# Patient Record
Sex: Female | Born: 1945 | Race: White | Hispanic: No | Marital: Married | Smoking: Never smoker
Health system: Southern US, Community
[De-identification: ages and names within clinical notes are randomized; demographics above are authoritative.]

## PROBLEM LIST (undated history)

## (undated) DIAGNOSIS — G14 Postpolio syndrome: Secondary | ICD-10-CM

## (undated) DIAGNOSIS — I10 Essential (primary) hypertension: Secondary | ICD-10-CM

## (undated) DIAGNOSIS — F329 Major depressive disorder, single episode, unspecified: Secondary | ICD-10-CM

## (undated) DIAGNOSIS — K37 Unspecified appendicitis: Secondary | ICD-10-CM

## (undated) DIAGNOSIS — F3289 Other specified depressive episodes: Secondary | ICD-10-CM

## (undated) DIAGNOSIS — Z853 Personal history of malignant neoplasm of breast: Secondary | ICD-10-CM

## (undated) DIAGNOSIS — Z923 Personal history of irradiation: Secondary | ICD-10-CM

## (undated) DIAGNOSIS — M7130 Other bursal cyst, unspecified site: Secondary | ICD-10-CM

## (undated) DIAGNOSIS — E039 Hypothyroidism, unspecified: Secondary | ICD-10-CM

## (undated) DIAGNOSIS — E785 Hyperlipidemia, unspecified: Secondary | ICD-10-CM

## (undated) DIAGNOSIS — R0609 Other forms of dyspnea: Secondary | ICD-10-CM

## (undated) DIAGNOSIS — R0989 Other specified symptoms and signs involving the circulatory and respiratory systems: Secondary | ICD-10-CM

## (undated) HISTORY — PX: OTHER SURGICAL HISTORY: SHX169

## (undated) HISTORY — DX: Hypothyroidism, unspecified: E03.9

## (undated) HISTORY — PX: ABDOMINAL HYSTERECTOMY: SHX81

## (undated) HISTORY — DX: Other forms of dyspnea: R06.09

## (undated) HISTORY — DX: Essential (primary) hypertension: I10

## (undated) HISTORY — PX: BREAST BIOPSY: SHX20

## (undated) HISTORY — DX: Hyperlipidemia, unspecified: E78.5

## (undated) HISTORY — DX: Personal history of irradiation: Z92.3

## (undated) HISTORY — PX: BREAST LUMPECTOMY: SHX2

## (undated) HISTORY — DX: Unspecified appendicitis: K37

## (undated) HISTORY — DX: Other bursal cyst, unspecified site: M71.30

## (undated) HISTORY — DX: Other specified symptoms and signs involving the circulatory and respiratory systems: R09.89

## (undated) HISTORY — DX: Other specified depressive episodes: F32.89

## (undated) HISTORY — DX: Postpolio syndrome: G14

## (undated) HISTORY — DX: Personal history of malignant neoplasm of breast: Z85.3

## (undated) HISTORY — DX: Major depressive disorder, single episode, unspecified: F32.9

---

## 2003-07-05 ENCOUNTER — Encounter: Payer: Self-pay | Admitting: General Surgery

## 2003-07-05 ENCOUNTER — Encounter: Admission: RE | Admit: 2003-07-05 | Discharge: 2003-07-05 | Payer: Self-pay | Admitting: General Surgery

## 2003-07-05 ENCOUNTER — Encounter (INDEPENDENT_AMBULATORY_CARE_PROVIDER_SITE_OTHER): Payer: Self-pay | Admitting: *Deleted

## 2003-07-10 ENCOUNTER — Encounter: Payer: Self-pay | Admitting: General Surgery

## 2003-07-10 ENCOUNTER — Encounter (HOSPITAL_COMMUNITY): Admission: RE | Admit: 2003-07-10 | Discharge: 2003-10-08 | Payer: Self-pay | Admitting: General Surgery

## 2003-07-16 ENCOUNTER — Ambulatory Visit (HOSPITAL_COMMUNITY): Admission: RE | Admit: 2003-07-16 | Discharge: 2003-07-16 | Payer: Self-pay

## 2007-07-06 ENCOUNTER — Encounter: Admission: RE | Admit: 2007-07-06 | Discharge: 2007-07-06 | Payer: Self-pay | Admitting: Internal Medicine

## 2007-07-12 ENCOUNTER — Encounter: Payer: Self-pay | Admitting: Internal Medicine

## 2007-10-10 ENCOUNTER — Ambulatory Visit: Payer: Self-pay | Admitting: Internal Medicine

## 2007-10-10 DIAGNOSIS — R0609 Other forms of dyspnea: Secondary | ICD-10-CM

## 2007-10-10 DIAGNOSIS — R0989 Other specified symptoms and signs involving the circulatory and respiratory systems: Secondary | ICD-10-CM

## 2007-10-10 DIAGNOSIS — M7130 Other bursal cyst, unspecified site: Secondary | ICD-10-CM | POA: Insufficient documentation

## 2007-10-11 ENCOUNTER — Encounter (INDEPENDENT_AMBULATORY_CARE_PROVIDER_SITE_OTHER): Payer: Self-pay | Admitting: *Deleted

## 2007-10-11 DIAGNOSIS — F329 Major depressive disorder, single episode, unspecified: Secondary | ICD-10-CM

## 2007-10-11 DIAGNOSIS — E039 Hypothyroidism, unspecified: Secondary | ICD-10-CM | POA: Insufficient documentation

## 2007-10-11 DIAGNOSIS — Z853 Personal history of malignant neoplasm of breast: Secondary | ICD-10-CM

## 2007-10-11 DIAGNOSIS — I10 Essential (primary) hypertension: Secondary | ICD-10-CM | POA: Insufficient documentation

## 2007-10-11 DIAGNOSIS — E785 Hyperlipidemia, unspecified: Secondary | ICD-10-CM | POA: Insufficient documentation

## 2007-10-17 ENCOUNTER — Encounter: Admission: RE | Admit: 2007-10-17 | Discharge: 2007-10-17 | Payer: Self-pay | Admitting: Internal Medicine

## 2007-10-18 ENCOUNTER — Ambulatory Visit: Payer: Self-pay | Admitting: Pulmonary Disease

## 2007-10-18 ENCOUNTER — Encounter: Payer: Self-pay | Admitting: Internal Medicine

## 2007-10-19 ENCOUNTER — Telehealth: Payer: Self-pay | Admitting: Internal Medicine

## 2007-10-20 ENCOUNTER — Telehealth: Payer: Self-pay | Admitting: Internal Medicine

## 2007-10-29 ENCOUNTER — Encounter: Payer: Self-pay | Admitting: Internal Medicine

## 2007-11-01 ENCOUNTER — Encounter (INDEPENDENT_AMBULATORY_CARE_PROVIDER_SITE_OTHER): Payer: Self-pay | Admitting: *Deleted

## 2007-11-01 ENCOUNTER — Encounter: Payer: Self-pay | Admitting: Internal Medicine

## 2007-11-30 DIAGNOSIS — K37 Unspecified appendicitis: Secondary | ICD-10-CM

## 2007-11-30 HISTORY — DX: Unspecified appendicitis: K37

## 2007-11-30 HISTORY — PX: APPENDECTOMY: SHX54

## 2007-12-04 ENCOUNTER — Ambulatory Visit: Payer: Self-pay | Admitting: Internal Medicine

## 2007-12-16 ENCOUNTER — Telehealth: Payer: Self-pay | Admitting: Internal Medicine

## 2007-12-17 ENCOUNTER — Encounter (INDEPENDENT_AMBULATORY_CARE_PROVIDER_SITE_OTHER): Payer: Self-pay | Admitting: General Surgery

## 2007-12-17 ENCOUNTER — Observation Stay (HOSPITAL_COMMUNITY): Admission: EM | Admit: 2007-12-17 | Discharge: 2007-12-18 | Payer: Self-pay | Admitting: Emergency Medicine

## 2008-01-19 ENCOUNTER — Encounter: Payer: Self-pay | Admitting: Internal Medicine

## 2008-01-24 ENCOUNTER — Encounter: Payer: Self-pay | Admitting: Internal Medicine

## 2008-01-31 ENCOUNTER — Ambulatory Visit: Payer: Self-pay | Admitting: Internal Medicine

## 2008-01-31 LAB — CONVERTED CEMR LAB
Basophils Relative: 1.2 % — ABNORMAL HIGH (ref 0.0–1.0)
Eosinophils Relative: 2.3 % (ref 0.0–5.0)
HDL: 33.7 mg/dL — ABNORMAL LOW (ref >39.0)
Lymphocytes Relative: 26.7 % (ref 12.0–46.0)
Neutro Abs: 3.9 10*3/uL (ref 1.4–7.7)
Platelets: 269 10*3/uL (ref 150–400)
RBC: 4.37 M/uL (ref 3.87–5.11)
TSH: 2.65 microintl units/mL (ref 0.35–5.50)
VLDL: 17 mg/dL (ref 0–40)
WBC: 6.5 10*3/uL (ref 4.5–10.5)

## 2008-02-01 ENCOUNTER — Encounter: Payer: Self-pay | Admitting: Internal Medicine

## 2008-02-05 ENCOUNTER — Encounter: Admission: RE | Admit: 2008-02-05 | Discharge: 2008-02-05 | Payer: Self-pay | Admitting: Internal Medicine

## 2008-02-05 ENCOUNTER — Encounter: Payer: Self-pay | Admitting: Internal Medicine

## 2008-02-20 DIAGNOSIS — Z9189 Other specified personal risk factors, not elsewhere classified: Secondary | ICD-10-CM | POA: Insufficient documentation

## 2008-02-20 DIAGNOSIS — B91 Sequelae of poliomyelitis: Secondary | ICD-10-CM

## 2008-02-21 ENCOUNTER — Ambulatory Visit: Payer: Self-pay | Admitting: Internal Medicine

## 2008-02-21 DIAGNOSIS — E663 Overweight: Secondary | ICD-10-CM | POA: Insufficient documentation

## 2008-06-13 ENCOUNTER — Ambulatory Visit: Payer: Self-pay | Admitting: Internal Medicine

## 2008-07-31 ENCOUNTER — Ambulatory Visit: Payer: Self-pay | Admitting: Internal Medicine

## 2008-07-31 LAB — CONVERTED CEMR LAB
ALT: 19 units/L (ref 0–35)
AST: 22 units/L (ref 0–37)
Basophils Absolute: 0.1 10*3/uL (ref 0.0–0.1)
Bilirubin, Direct: 0.1 mg/dL (ref 0.0–0.3)
CO2: 31 meq/L (ref 19–32)
Chloride: 107 meq/L (ref 96–112)
Cholesterol: 285 mg/dL (ref 0–200)
HDL: 35.5 mg/dL — ABNORMAL LOW (ref >39.0)
Lymphocytes Relative: 22.1 % (ref 12.0–46.0)
MCHC: 34.1 g/dL (ref 30.0–36.0)
Neutrophils Relative %: 67.9 % (ref 43.0–77.0)
Platelets: 239 10*3/uL (ref 150–400)
RDW: 12 % (ref 11.5–14.6)
Sodium: 141 meq/L (ref 135–145)
TSH: 2.08 microintl units/mL (ref 0.35–5.50)
Total Bilirubin: 0.9 mg/dL (ref 0.3–1.2)
VLDL: 28 mg/dL (ref 0–40)

## 2008-08-02 ENCOUNTER — Encounter: Payer: Self-pay | Admitting: Internal Medicine

## 2008-08-06 ENCOUNTER — Encounter: Payer: Self-pay | Admitting: Internal Medicine

## 2008-08-08 ENCOUNTER — Encounter: Payer: Self-pay | Admitting: Internal Medicine

## 2008-08-12 ENCOUNTER — Encounter: Payer: Self-pay | Admitting: Internal Medicine

## 2008-08-15 ENCOUNTER — Ambulatory Visit: Payer: Self-pay | Admitting: Cardiology

## 2009-01-27 ENCOUNTER — Telehealth (INDEPENDENT_AMBULATORY_CARE_PROVIDER_SITE_OTHER): Payer: Self-pay | Admitting: *Deleted

## 2009-02-10 ENCOUNTER — Ambulatory Visit: Payer: Self-pay | Admitting: Internal Medicine

## 2009-02-10 LAB — CONVERTED CEMR LAB
ALT: 18 units/L (ref 0–35)
AST: 19 units/L (ref 0–37)
Alkaline Phosphatase: 92 units/L (ref 39–117)
Basophils Absolute: 0.1 10*3/uL (ref 0.0–0.1)
Basophils Relative: 0.9 % (ref 0.0–3.0)
Bilirubin, Direct: 0.1 mg/dL (ref 0.0–0.3)
CO2: 29 meq/L (ref 19–32)
Chloride: 101 meq/L (ref 96–112)
Creatinine, Ser: 1.5 mg/dL — ABNORMAL HIGH (ref 0.4–1.2)
HDL: 35 mg/dL — ABNORMAL LOW (ref >39.0)
Lymphocytes Relative: 24 % (ref 12.0–46.0)
MCHC: 33.4 g/dL (ref 30.0–36.0)
Neutrophils Relative %: 60.5 % (ref 43.0–77.0)
Platelets: 217 10*3/uL (ref 150–400)
Potassium: 3.9 meq/L (ref 3.5–5.1)
RBC: 4.17 M/uL (ref 3.87–5.11)
Sodium: 142 meq/L (ref 135–145)
Total Bilirubin: 1 mg/dL (ref 0.3–1.2)
Triglycerides: 218 mg/dL (ref 0–149)
VLDL: 44 mg/dL — ABNORMAL HIGH (ref 0–40)

## 2009-02-12 ENCOUNTER — Encounter: Payer: Self-pay | Admitting: Internal Medicine

## 2009-02-19 ENCOUNTER — Encounter: Payer: Self-pay | Admitting: Internal Medicine

## 2009-02-21 ENCOUNTER — Encounter: Payer: Self-pay | Admitting: Internal Medicine

## 2009-03-04 ENCOUNTER — Ambulatory Visit: Payer: Self-pay | Admitting: Internal Medicine

## 2009-03-04 LAB — CONVERTED CEMR LAB
Free T4: 0.8 ng/dL (ref 0.6–1.6)
T3 Uptake Ratio: 39 % — ABNORMAL HIGH (ref 22.5–37.0)
Vit D, 25-Hydroxy: 22 ng/mL — ABNORMAL LOW (ref 30–89)

## 2009-03-09 ENCOUNTER — Encounter: Payer: Self-pay | Admitting: Internal Medicine

## 2009-03-10 ENCOUNTER — Telehealth (INDEPENDENT_AMBULATORY_CARE_PROVIDER_SITE_OTHER): Payer: Self-pay | Admitting: *Deleted

## 2009-03-11 ENCOUNTER — Ambulatory Visit: Payer: Self-pay | Admitting: Internal Medicine

## 2009-03-11 DIAGNOSIS — M79609 Pain in unspecified limb: Secondary | ICD-10-CM

## 2009-03-13 ENCOUNTER — Encounter: Admission: RE | Admit: 2009-03-13 | Discharge: 2009-03-13 | Payer: Self-pay | Admitting: Orthopedic Surgery

## 2009-06-16 ENCOUNTER — Ambulatory Visit: Payer: Self-pay | Admitting: Internal Medicine

## 2009-06-20 ENCOUNTER — Ambulatory Visit: Payer: Self-pay | Admitting: Internal Medicine

## 2009-06-20 LAB — CONVERTED CEMR LAB
AST: 28 units/L (ref 0–37)
Albumin: 3.9 g/dL (ref 3.5–5.2)
Alkaline Phosphatase: 84 units/L (ref 39–117)
Anti Nuclear Antibody(ANA): POSITIVE — AB
BUN: 20 mg/dL (ref 6–23)
Bilirubin, Direct: 0.1 mg/dL (ref 0.0–0.3)
Calcium: 9.2 mg/dL (ref 8.4–10.5)
Cholesterol: 254 mg/dL — ABNORMAL HIGH (ref 0–200)
Creatinine, Ser: 1.2 mg/dL (ref 0.4–1.2)
Direct LDL: 207.6 mg/dL
Sed Rate: 19 mm/hr (ref 0–22)
Total CK: 163 units/L (ref 7–177)
Total Protein: 7.2 g/dL (ref 6.0–8.3)

## 2009-06-23 ENCOUNTER — Encounter: Payer: Self-pay | Admitting: Internal Medicine

## 2009-06-23 LAB — CONVERTED CEMR LAB: CRP, High Sensitivity: 4.3 — ABNORMAL HIGH

## 2009-06-25 ENCOUNTER — Encounter: Payer: Self-pay | Admitting: Internal Medicine

## 2009-08-11 ENCOUNTER — Encounter: Payer: Self-pay | Admitting: Internal Medicine

## 2009-08-28 ENCOUNTER — Encounter: Payer: Self-pay | Admitting: Internal Medicine

## 2009-11-27 ENCOUNTER — Encounter: Payer: Self-pay | Admitting: Internal Medicine

## 2010-01-19 ENCOUNTER — Ambulatory Visit: Payer: Self-pay | Admitting: Internal Medicine

## 2010-01-19 ENCOUNTER — Encounter: Payer: Self-pay | Admitting: Internal Medicine

## 2010-01-19 DIAGNOSIS — J069 Acute upper respiratory infection, unspecified: Secondary | ICD-10-CM | POA: Insufficient documentation

## 2010-01-19 DIAGNOSIS — M109 Gout, unspecified: Secondary | ICD-10-CM | POA: Insufficient documentation

## 2010-01-19 LAB — CONVERTED CEMR LAB
Eosinophils Relative: 2.5 % (ref 0.0–5.0)
HCT: 38.7 % (ref 36.0–46.0)
Hemoglobin: 13 g/dL (ref 12.0–15.0)
Lymphs Abs: 1.7 10*3/uL (ref 0.7–4.0)
MCV: 90.8 fL (ref 78.0–100.0)
Monocytes Absolute: 0.9 10*3/uL (ref 0.1–1.0)
Monocytes Relative: 8 % (ref 3.0–12.0)
Neutro Abs: 8.9 10*3/uL — ABNORMAL HIGH (ref 1.4–7.7)
Platelets: 227 10*3/uL (ref 150.0–400.0)
RDW: 12.4 % (ref 11.5–14.6)
Uric Acid, Serum: 7.8 mg/dL — ABNORMAL HIGH (ref 2.4–7.0)
WBC: 11.8 10*3/uL — ABNORMAL HIGH (ref 4.5–10.5)

## 2010-01-20 ENCOUNTER — Ambulatory Visit: Payer: Self-pay | Admitting: Internal Medicine

## 2010-01-21 ENCOUNTER — Encounter: Payer: Self-pay | Admitting: Internal Medicine

## 2010-01-27 ENCOUNTER — Telehealth: Payer: Self-pay | Admitting: Internal Medicine

## 2010-02-26 ENCOUNTER — Encounter: Payer: Self-pay | Admitting: Internal Medicine

## 2010-07-23 ENCOUNTER — Ambulatory Visit: Payer: Self-pay | Admitting: Internal Medicine

## 2010-07-24 ENCOUNTER — Telehealth: Payer: Self-pay | Admitting: Internal Medicine

## 2010-07-27 ENCOUNTER — Encounter: Payer: Self-pay | Admitting: Internal Medicine

## 2010-07-27 ENCOUNTER — Ambulatory Visit: Payer: Self-pay | Admitting: Family Medicine

## 2010-07-30 ENCOUNTER — Encounter: Payer: Self-pay | Admitting: Internal Medicine

## 2010-07-31 ENCOUNTER — Telehealth: Payer: Self-pay | Admitting: Internal Medicine

## 2010-08-11 ENCOUNTER — Telehealth: Payer: Self-pay | Admitting: Internal Medicine

## 2010-08-13 ENCOUNTER — Telehealth: Payer: Self-pay | Admitting: Internal Medicine

## 2010-08-14 ENCOUNTER — Ambulatory Visit: Payer: Self-pay | Admitting: Internal Medicine

## 2010-08-19 ENCOUNTER — Ambulatory Visit: Payer: Self-pay | Admitting: Internal Medicine

## 2010-08-19 LAB — CONVERTED CEMR LAB
Basophils Absolute: 0.1 10*3/uL (ref 0.0–0.1)
Basophils Relative: 0.6 % (ref 0.0–3.0)
Eosinophils Absolute: 0.1 10*3/uL (ref 0.0–0.7)
Eosinophils Relative: 1.4 % (ref 0.0–5.0)
HCT: 40.1 % (ref 36.0–46.0)
Hemoglobin: 13.8 g/dL (ref 12.0–15.0)
Lymphocytes Relative: 23.8 % (ref 12.0–46.0)
Lymphs Abs: 2.1 10*3/uL (ref 0.7–4.0)
MCHC: 34.3 g/dL (ref 30.0–36.0)
MCV: 90.5 fL (ref 78.0–100.0)
Monocytes Absolute: 0.7 10*3/uL (ref 0.1–1.0)
Monocytes Relative: 7.7 % (ref 3.0–12.0)
Neutro Abs: 6 10*3/uL (ref 1.4–7.7)
Neutrophils Relative %: 66.5 % (ref 43.0–77.0)
Platelets: 194 10*3/uL (ref 150.0–400.0)
RBC: 4.44 M/uL (ref 3.87–5.11)
RDW: 13 % (ref 11.5–14.6)
TSH: 2 microintl units/mL (ref 0.35–5.50)
WBC: 9 10*3/uL (ref 4.5–10.5)

## 2010-08-25 ENCOUNTER — Telehealth: Payer: Self-pay | Admitting: Internal Medicine

## 2010-08-27 ENCOUNTER — Ambulatory Visit: Payer: Self-pay | Admitting: Family Medicine

## 2010-08-27 ENCOUNTER — Encounter: Payer: Self-pay | Admitting: Internal Medicine

## 2010-08-27 DIAGNOSIS — M76829 Posterior tibial tendinitis, unspecified leg: Secondary | ICD-10-CM | POA: Insufficient documentation

## 2010-08-27 DIAGNOSIS — M2170 Unequal limb length (acquired), unspecified site: Secondary | ICD-10-CM | POA: Insufficient documentation

## 2010-08-27 DIAGNOSIS — M461 Sacroiliitis, not elsewhere classified: Secondary | ICD-10-CM | POA: Insufficient documentation

## 2010-09-04 ENCOUNTER — Encounter: Payer: Self-pay | Admitting: Internal Medicine

## 2010-10-01 ENCOUNTER — Ambulatory Visit: Payer: Self-pay | Admitting: Internal Medicine

## 2010-10-29 ENCOUNTER — Encounter: Payer: Self-pay | Admitting: Internal Medicine

## 2010-11-03 ENCOUNTER — Encounter: Payer: Self-pay | Admitting: Internal Medicine

## 2010-11-03 ENCOUNTER — Encounter: Admission: RE | Admit: 2010-11-03 | Discharge: 2010-11-03 | Payer: Self-pay | Admitting: Orthopedic Surgery

## 2010-12-20 ENCOUNTER — Encounter: Payer: Self-pay | Admitting: Internal Medicine

## 2010-12-29 NOTE — Assessment & Plan Note (Signed)
Summary: PAIN IN BACK/NWS #   Vital Signs:  Patient profile:   65 year old female Height:      67 inches O2 Sat:      97 % on Room air Temp:     98.0 degrees F oral Pulse rate:   105 / minute BP sitting:   119 / 88  (left arm) Cuff size:   regular  Vitals Entered By: Bill Salinas CMA (July 23, 2010 1:51 PM)  O2 Flow:  Room air CC: Pt here with c/o ongoing back pain/ ab Comments Pt states she is no longer taking multiplevit, asa, fish oil or Levaquin/ ab   Primary Care Meena Barrantes:  Norins  CC:  Pt here with c/o ongoing back pain/ ab.  History of Present Illness: Patient presents for evaluation of back pain. She fell down a flight of stairs Aug 4th. She had low back pain. She went to see Dr. Lajoyce Corners August 10th - she had x-rays. She was told she had strain but no fracture. She reports that he put her on steroids.This has not helped and the pain is worse. She hurts when she sits, stands, when she gets up from a chair. No pain when laying down. The pain is worse with position chaqnge and initiating walking. She points to the buttock over the SI joint right much worse than left. No paresthesia, no focal weakness. No loss of control  bowel or bladder.   Reviewed L-S spine film on CD: lateral view - normal vertebra and disk space, A_P view - scoliosis, no vertebral abnl noted. SI joints visualized but not well.   Current Medications (verified): 1)  Levothyroxine Sodium 50 Mcg  Tabs (Levothyroxine Sodium) .Marland Kitchen.. 1 By Mouth Once Daily 2)  Triamterene-Hctz 75-50 Mg  Tabs (Triamterene-Hctz) .Marland Kitchen.. 1 By Mouth Once Daily 3)  Multivitamins   Tabs (Multiple Vitamin) .Marland Kitchen.. 1 By Mouth Once Daily 4)  Adult Aspirin Low Strength 81 Mg  Tbdp (Aspirin) .... Take 1 Tablet By Mouth Once A Day 5)  Fish Oil 1200 Mg Caps (Omega-3 Fatty Acids) .... Take 1 Tablet By Mouth Two Times A Day 6)  Allopurinol 300 Mg Tabs (Allopurinol) .Marland Kitchen.. 1 By Mouth Once Daily For Gout 7)  Levaquin 500 Mg Tabs (Levofloxacin) .Marland Kitchen.. 1 Tab  Once A Day X 7 Days 8)  Nabumetone 750 Mg Tabs (Nabumetone) .Marland Kitchen.. 1 Tab Two Times A Day  Allergies (verified): 1)  ! Zocor 2)  ! * Antibiotics 3)  ! Sulfa 4)  ! Epinephrine 5)  ! Codeine 6)  ! Enalapril Maleate (Enalapril Maleate) 7)  ! 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) HYPOTHYROIDISM (ICD-244.9) HYPERTENSION (ICD-401.9) HYPERLIPIDEMIA (ICD-272.4) DEPRESSION (ICD-311) BREAST CANCER, HX OF (ICD-V10.3) APPENDICITIS jAN '09     Past Surgical History: Last updated: 02/21/2008 * Hx of POLIO RELATED SURGERIES BREAST BIOPSY, HX OF (ICD-V15.9) * Hx of HYSTERECTOMY * Hx of LUMPECTOMY RIGHT BREAST Appendectomy jAN '09  Family History: Last updated: 10/10/2007 non-contributory  Social History: Last updated: 10/10/2007 married SO- retired Optician, dispensing, 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  The patient denies anorexia, fever, weight loss, weight gain, decreased hearing, chest pain, dyspnea on exertion, prolonged cough, hemoptysis, abdominal pain, hematochezia, hematuria, muscle weakness, transient blindness, unusual weight change, and enlarged lymph nodes.  Physical Exam  General:  Overweight white female Head:  normocephalic and atraumatic.   Neck:  supple, full ROM, and no thyromegaly.   Lungs:  normal respiratory effort and normal breath sounds.   Heart:  normal rate and regular rhythm.   Msk:  back exam: able to stand without assistance, nl flex to 180 degrees, nl gait, nl heel walk, able to get up to exam table without assistance, nl SLR sitting, nl patellar reflex, nl sensation. Point tenderness in the poximity of the right SI joint.   Impression & Recommendations:  Problem # 1:  LOW BACK PAIN,  ACUTE (ICD-724.2)  Patient with acute pain at the SI joint. Non-radicular exam.  Plan - steroid injection: verbal consent obtained, prepped with betadine, injected 80mg  depomedrol with 1 cc 1% xylocaine at the point of maximum tenderness. Bandaid applied. Tolerated well with immediate relief           Pelvic x-ray r/o fracture.  Her updated medication list for this problem includes:    Adult Aspirin Low Strength 81 Mg Tbdp (Aspirin) .Marland Kitchen... Take 1 tablet by mouth once a day    Nabumetone 750 Mg Tabs (Nabumetone) .Marland Kitchen... 1 tab two times a day  Orders: T-Pelvis 1or 2 views (72170TC) T-Sacroiliac Joints ([address]UE) Trigger Point Injection (1 or 2 muscles) (45409) Depo- Medrol 80mg  (J1040)  Addendum- X-ray pelvis and SI joints normal  Complete Medication List: 1)  Levothyroxine Sodium 50 Mcg Tabs (Levothyroxine sodium) .Marland Kitchen.. 1 by mouth once daily 2)  Triamterene-hctz 75-50 Mg Tabs (Triamterene-hctz) .Marland Kitchen.. 1 by mouth once daily 3)  Multivitamins Tabs (Multiple vitamin) .Marland Kitchen.. 1 by mouth once daily 4)  Adult Aspirin Low Strength 81 Mg Tbdp (Aspirin) .... Take 1 tablet by mouth once a day 5)  Fish Oil 1200 Mg Caps (Omega-3 fatty acids) .... Take 1 tablet by mouth two times a day 6)  Allopurinol 300 Mg Tabs (Allopurinol) .Marland Kitchen.. 1 by mouth once daily for gout 7)  Levaquin 500 Mg Tabs (Levofloxacin) .Marland Kitchen.. 1 tab once a day x 7 days 8)  Nabumetone 750 Mg Tabs (Nabumetone) .Marland Kitchen.. 1 tab two times a day

## 2010-12-29 NOTE — Letter (Signed)
Summary: [facility]  [facility]   Imported By: Sherian Rein 09/08/2010 15:10:44  _____________________________________________________________________  External Attachment:    Type:   Image     Comment:   External Document

## 2010-12-29 NOTE — Progress Notes (Signed)
Summary: LEVAQUIN  Phone Note Call from Patient   Summary of Call: Pt is somewhat concerned about cost of levaquin (80$). She is aware of her allergy to all other antibiotics. I suggested she print online coupon from drug company. Pt does feel much better. No fever, cough went from all day & night to 'every once in a while'. She has a little sinus congestion & drainage. Pt played 2  tennis matches yesterday with no problems. She will take rx or call office if symptoms become worse.  Initial call taken by: Lamar Sprinkles, CMA,  January 27, 2010 5:32 PM

## 2010-12-29 NOTE — Letter (Signed)
   [facility] [address] [place], [place]  [address] Phone: [phone]      September 04, 2010   [name] [address] [place], [place] [address]  RE:  LAB RESULTS  Dear  [name],  The following is an interpretation of your most recent lab tests.  Please take note of any instructions provided or changes to medications that have resulted from your lab work.   Your bone density study revealed normal bone density.   Please come see me if you have any questions about these lab results.   Sincerely Yours,    Jacques Navy MD

## 2010-12-29 NOTE — Letter (Signed)
Summary: Email from patient regarding illness  Email from patient regarding illness   Imported By: Sherian Rein 01/26/2010 11:45:56  _____________________________________________________________________  External Attachment:    Type:   Image     Comment:   External Document

## 2010-12-29 NOTE — Letter (Signed)
Summary: [facility]   Imported By: Lester Nevis 03/12/2010 10:40:41  _____________________________________________________________________  External Attachment:    Type:   Image     Comment:   External Document

## 2010-12-29 NOTE — Letter (Signed)
Summary: [facility]   Imported By: Lester Nash 12/08/2009 09:58:44  _____________________________________________________________________  External Attachment:    Type:   Image     Comment:   External Document

## 2010-12-29 NOTE — Progress Notes (Signed)
  Phone Note Refill Request Message from:  Fax from Pharmacy on August 11, 2010 3:40 PM  Refills Requested: Medication #1:  TRIAMTERENE-HCTZ 75-50 MG  TABS 1 by mouth once daily Initial call taken by: Ami Bullins CMA,  August 11, 2010 3:40 PM    Prescriptions: TRIAMTERENE-HCTZ 75-50 MG  TABS (TRIAMTERENE-HCTZ) 1 by mouth once daily  #30 Each x 6   Entered by:   Ami Bullins CMA   Authorized by:   Jacques Navy MD   Signed by:   Bill Salinas CMA on 08/11/2010   Method used:   Electronically to        [facility]* (retail)       [address]       [place], [place]  [id number]       Ph: [phone]       Fax: [phone]   RxID:   [id number]

## 2010-12-29 NOTE — Letter (Signed)
Summary: Email from patient  Email from patient   Imported By: Lester [place] 08/04/2010 12:56:15  _____________________________________________________________________  External Attachment:    Type:   Image     Comment:   External Document

## 2010-12-29 NOTE — Assessment & Plan Note (Signed)
Summary: trigger point injection   Vital Signs:  Patient profile:   65 year old female Height:      67 inches Weight:      206 pounds BMI:     32.38 O2 Sat:      96 % on Room air Temp:     97.7 degrees F oral Pulse rate:   86 / minute BP sitting:   136 / 88  (left arm) Cuff size:   regular  Vitals Entered By: Bill Salinas CMA (August 14, 2010 9:36 AM)  O2 Flow:  Room air CC: pt here for trigger point injection/ ab Comments Pt states the only two medications she takes are the levothyroxine and triamterine. other meds can be dc'd/ ab   Primary Care Provider:  Norins  CC:  pt here for trigger point injection/ ab.  History of Present Illness: Patient with pain in the right buttock with radiation to the leg. She presents for trigger point injection. she has been referred to Dr. Dallas Schimke for evaluation for possible MSK origin   Current Medications (verified): 1)  Levothyroxine Sodium 50 Mcg  Tabs (Levothyroxine Sodium) .Marland Kitchen.. 1 By Mouth Once Daily 2)  Triamterene-Hctz 75-50 Mg  Tabs (Triamterene-Hctz) .Marland Kitchen.. 1 By Mouth Once Daily 3)  Multivitamins   Tabs (Multiple Vitamin) .Marland Kitchen.. 1 By Mouth Once Daily 4)  Adult Aspirin Low Strength 81 Mg  Tbdp (Aspirin) .... Take 1 Tablet By Mouth Once A Day 5)  Fish Oil 1200 Mg Caps (Omega-3 Fatty Acids) .... Take 1 Tablet By Mouth Two Times A Day 6)  Allopurinol 300 Mg Tabs (Allopurinol) .Marland Kitchen.. 1 By Mouth Once Daily For Gout 7)  Levaquin 500 Mg Tabs (Levofloxacin) .Marland Kitchen.. 1 Tab Once A Day X 7 Days 8)  Nabumetone 750 Mg Tabs (Nabumetone) .Marland Kitchen.. 1 Tab Two Times A Day  Allergies (verified): 1)  ! Zocor 2)  ! * Antibiotics 3)  ! Sulfa 4)  ! Epinephrine 5)  ! Codeine 6)  ! Enalapril Maleate (Enalapril Maleate) 7)  ! 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  The patient denies anorexia, fever, weight loss, chest pain, syncope, dyspnea on exertion, abdominal pain, muscle weakness, depression, and enlarged lymph nodes.    Physical Exam  General:  loverweight white female in no acute distress Head:  normocephalic and atraumatic.   Lungs:  normal respiratory effort and normal breath sounds.   Heart:  normal rate and regular rhythm.   Neurologic:  tender deep tissue of buttock just lateral to SI joint.   Impression & Recommendations:  Problem # 1:  LOW BACK PAIN, ACUTE (ICD-724.2)  for trigger point injection. Tolerated well and had rapid relief. No complications.   Her updated medication list for this problem includes:    Adult Aspirin Low Strength 81 Mg Tbdp (Aspirin) .Marland Kitchen... Take 1 tablet by mouth once a day    Nabumetone 750 Mg Tabs (Nabumetone) .Marland Kitchen... 1 tab two  times a day  Orders: Trigger Point Injection (1 or 2 muscles) (54098) Depo- Medrol 40mg  (J1030)  Complete Medication List: 1)  Levothyroxine Sodium 50 Mcg Tabs (Levothyroxine sodium) .Marland Kitchen.. 1 by mouth once daily 2)  Triamterene-hctz 75-50 Mg Tabs (Triamterene-hctz) .Marland Kitchen.. 1 by mouth once daily 3)  Multivitamins Tabs (Multiple vitamin) .Marland Kitchen.. 1 by mouth once daily 4)  Adult Aspirin Low Strength 81 Mg Tbdp (Aspirin) .... Take 1 tablet by mouth once a day 5)  Fish Oil 1200 Mg Caps (Omega-3 fatty acids) .... Take 1 tablet by mouth two times a day 6)  Allopurinol 300 Mg Tabs (Allopurinol) .Marland Kitchen.. 1 by mouth once daily for gout 7)   Levaquin 500 Mg Tabs (Levofloxacin) .Marland Kitchen.. 1 tab once a day x 7 days 8)  Nabumetone 750 Mg Tabs (Nabumetone) .Marland Kitchen.. 1 tab two times a day   Procedure Note Last Tetanus: Historical (12/30/2001)  Injections: The patient complains of pain and tenderness. Indication: acute pain  Procedure # 1: trigger point injection    Location: right buttock    Technique: 23g 1.5" needle    Medication: 40 mg depomedrol    Anesthesia: xylocain 1% plain    Comment: informed verbal consent. Tolerated procedure well and had rapid relief due to xylocain  Cleaned and prepped with: betadine

## 2010-12-29 NOTE — Assessment & Plan Note (Signed)
Summary: BACK PROBLEM/ PER E-MAIL/ $50 /NWS   Vital Signs:  Patient profile:   65 year old female Height:      67 inches Weight:      208 pounds BMI:     32.70 O2 Sat:      97 % on Room air Temp:     97.7 degrees F oral Pulse rate:   100 / minute BP sitting:   122 / 80  (left arm) Cuff size:   large  Vitals Entered By: Bill Salinas CMA (June 16, 2009 3:56 PM)  O2 Flow:  Room air CC: pt here for on going back problems and c/o right eye oozing a clear substance x 1 month/ pt states she is no longer taking  Ergocalciferol, crestor, cyclobenzaprine or gabapentin/ ab   Primary Care Provider:  Lise Pincus  CC:  pt here for on going back problems and c/o right eye oozing a clear substance x 1 month/ pt states she is no longer taking  Ergocalciferol, crestor, and cyclobenzaprine or gabapentin/ ab.  History of Present Illness: Patinet has been having back trouble: a weariness in the back where she feels she must stop and rest. No frank pain. She will get immediate relief if she sits down in an armchair. She will notice that she cannot continue to play tennis when she is having trouble. No paresthesis, no frank weakness. The level of discomfort is at the bra line level. No arm weakness of paresthesia. No breathing trouble/shortness of breath. No problems at rest, only with exertion. These sympotms have been present for several years but are worse now then  before.  chart reviewed: last CT chest in March '09- unchanged pulmonary nodule, pulmonary scarring, no active lesion.   Current Medications (verified): 1)  Levothyroxine Sodium 50 Mcg  Tabs (Levothyroxine Sodium) .Marland Kitchen.. 1 By Mouth Once Daily 2)  Triamterene-Hctz 75-50 Mg  Tabs (Triamterene-Hctz) .Marland Kitchen.. 1 By Mouth Once Daily 3)  Multivitamins   Tabs (Multiple Vitamin) .Marland Kitchen.. 1 By Mouth Once Daily 4)  Adult Aspirin Low Strength 81 Mg  Tbdp (Aspirin) .... Take 1 Tablet By Mouth Once A Day 5)  Fish Oil 1200 Mg Caps (Omega-3 Fatty Acids) .... Take 1  Tablet By Mouth Two Times A Day 6)  Ergocalciferol 50000 Unit Caps (Ergocalciferol) .Marland Kitchen.. 1 A Week For 8 Weeks. 7)  Crestor 5 Mg Tabs (Rosuvastatin Calcium) .... Take One Tab By Mouth Every 3 Days 8)  Cyclobenzaprine Hcl 5 Mg Tabs (Cyclobenzaprine Hcl) .Marland Kitchen.. 1 By Mouth Three Times A Day As Needed Spasm in Foot 9)  Gabapentin 100 Mg Caps (Gabapentin) .... 21 By Mouth Once Daily X 3, 1 By Mouth Two Times A Day X 3, Then 1 By Mouth Three Times A Day X7-10 Days and Reassess.  Allergies (verified): 1)  ! Zocor 2)  ! * Antibiotics 3)  ! Sulfa 4)  ! Epinephrine 5)  ! Codeine 6)  ! Enalapril Maleate (Enalapril Maleate) 7)  ! Taxol PMH-FH-SH reviewed for relevance  Physical Exam  General:  heavy set white female Head:  normocephalic and atraumatic.   Lungs:  normal respiratory effort, no intercostal retractions, no accessory muscle use, normal breath sounds, no dullness, and no wheezes.   Heart:  normal rate and regular rhythm.   Msk:  normal ROM, no joint tenderness, no joint swelling, no joint warmth, and no joint deformities.   Pulses:  2+ radial pulses bilaterally.  Neurologic:  Normal motor strength. Skin:  turgor normal and color  normal.   Psych:  Oriented X3, memory intact for recent and remote, and normally interactive.     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 She reports that this is progressively getting worse but she has no limitation in her activities for the most part. Will rule out connective tissue/inflammatory disease.  Plan: ESR, CRP, ANA, CK total  Complete Medication List: 1)  Levothyroxine Sodium 50 Mcg Tabs (Levothyroxine sodium) .Marland Kitchen.. 1 by mouth once daily 2)  Triamterene-hctz 75-50 Mg Tabs (Triamterene-hctz) .Marland Kitchen.. 1 by mouth once daily 3)  Multivitamins Tabs (Multiple vitamin) .Marland Kitchen.. 1 by mouth once daily 4)  Adult Aspirin Low Strength 81 Mg Tbdp (Aspirin)  .... Take 1 tablet by mouth once a day 5)  Fish Oil 1200 Mg Caps (Omega-3 fatty acids) .... Take 1 tablet by mouth two times a day 6)  Ergocalciferol 50000 Unit Caps (Ergocalciferol) .Marland Kitchen.. 1 a week for 8 weeks. 7)  Crestor 5 Mg Tabs (Rosuvastatin calcium) .... Take one tab by mouth every 3 days 8)  Cyclobenzaprine Hcl 5 Mg Tabs (Cyclobenzaprine hcl) .Marland Kitchen.. 1 by mouth three times a day as needed spasm in foot 9)  Gabapentin 100 Mg Caps (Gabapentin) .... 21 by mouth once daily x 3, 1 by mouth two times a day x 3, then 1 by mouth three times a day x7-10 days and reassess.   Preventive Care Screening  Mammogram:    Date:  08/06/2008    Results:  normal   Last Tetanus Booster:    Date:  12/30/2001    Results:  Historical

## 2010-12-29 NOTE — Assessment & Plan Note (Signed)
Summary: low back pain/cd   Vital Signs:  Patient profile:   65 year old female Height:      67 inches Weight:      206 pounds BMI:     32.38 O2 Sat:      96 % on Room air Temp:     98.6 degrees F oral Pulse rate:   92 / minute BP sitting:   160 / 100  (left arm) Cuff size:   large  Vitals Entered By: Bill Salinas CMA (October 01, 2010 9:49 AM)  O2 Flow:  Room air CC: pt here for evaluation of low back pain. she is no longer taking allopurinol/ ab   Primary Care Luqman Perrelli:  Norins  CC:  pt here for evaluation of low back pain. she is no longer taking allopurinol/ ab.  History of Present Illness: presents for persistent back pain. She can actually feel something slip out of place and then pop back into place. The pain will subside when she can "Pop" her back into place. this is a long term problem. She has seen Dr. Dallas Schimke. 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 She reports that when this was addressed in the past she had more pain. Dr. Dallas Schimke has started treatment with a small wedge in the left shoe and will at the next visit custom make an orthotic.  Today she is seeking relief of her pain. She did have a trigger point and SI joint injection in the past which did help.   Current Medications (verified): 1)  Levothyroxine Sodium 50 Mcg  Tabs (Levothyroxine Sodium) .Marland Kitchen.. 1 By Mouth Once Daily 2)  Triamterene-Hctz 75-50 Mg  Tabs (Triamterene-Hctz) .Marland Kitchen.. 1 By Mouth Once Daily 3)  Allopurinol 300 Mg Tabs (Allopurinol) .Marland Kitchen.. 1 By Mouth Once Daily For Gout  Allergies (verified): 1)  ! Zocor 2)  ! * Antibiotics 3)  ! Sulfa 4)  ! Epinephrine 5)  ! Codeine 6)  ! Enalapril Maleate (Enalapril Maleate) 7)  ! Taxol PMH-FH-SH reviewed-no changes except otherwise noted  Review of Systems       The patient complains of difficulty walking.  The patient denies anorexia, fever, weight loss, chest pain, dyspnea on  exertion, headaches, abdominal pain, incontinence, muscle weakness, depression, abnormal bleeding, and enlarged lymph nodes.    Physical Exam  General:  overweight woman in no acute distress Head:  normocephalic and atraumatic.   Eyes:  vision grossly intact, pupils equal, pupils round, and corneas and lenses clear.   Neck:  supple.   Lungs:  normal respiratory effort and normal breath sounds.   Heart:  normal rate and regular rhythm.   Msk:  no joint tenderness, no joint swelling, and no joint deformities.  Tender to palpation and pressure in the area of the right SI joint.  Pulses:  2+ pulse Neurologic:  alert & oriented X3, cranial nerves II-XII intact, and gait normal.   Skin:  turgor normal and color normal.   Psych:  Oriented X3, normally interactive, good eye contact, and not anxious appearing.     Impression & Recommendations:  Problem # 1:  SACROILIITIS, RIGHT (ICD-720.2)  patient with increased pain in the area of the right SI joint.  Plan - SI joint/trigger point injection           keep follow-up with Dr. Dallas Schimke.  Orders: Trigger Point Injection Single Tendon Origin/Insertion [phone]) Depo- Medrol 80mg  (J1040)  Complete Medication List:  1)  Levothyroxine Sodium 50 Mcg Tabs (Levothyroxine sodium) .Marland Kitchen.. 1 by mouth once daily 2)  Triamterene-hctz 75-50 Mg Tabs (Triamterene-hctz) .Marland Kitchen.. 1 by mouth once daily 3)  Allopurinol 300 Mg Tabs (Allopurinol) .Marland Kitchen.. 1 by mouth once daily for gout   Orders Added: 1)  Trigger Point Injection Single Tendon Origin/Insertion [20551] 2)  Depo- Medrol 80mg  [J1040] 3)  Est. Patient Level III [[address]]     Procedure Note Last Tetanus: Historical (12/30/2001)  Injections: The patient complains of pain and inflammation. Indication: acute pain  Procedure # 1: trigger point injection    Location: area of right SI joint    Technique: #23g 1.5 inch needle    Medication: 80 mg depomedrol    Anesthesia: 2% xylcoaine    Comment: routine  verbal informed consent. After deep injection patient did have rapid relief of pain , but was not 100% relieved.  Cleaned and prepped with: betadine Wound dressing: bandaid Additional Instructions: routine wound precautions.

## 2010-12-29 NOTE — Miscellaneous (Signed)
Summary: BONE DENSITY  Clinical Lists Changes  Orders: Added new Test order of T-Bone Densitometry (77080) - Signed Added new Test order of T-Lumbar Vertebral Assessment (77082) - Signed 

## 2010-12-29 NOTE — Progress Notes (Signed)
  Phone Note Outgoing Call   Reason for Call: Discuss lab or test results Summary of Call: sputum culture with abudant staph aureus that was sensitive to all antibiotic. If she is still symptomatic - Levaquin 500mg  once daily x 7.  Thanks Initial call taken by: Jacques Navy MD,  January 27, 2010 12:13 PM    New/Updated Medications: LEVAQUIN 500 MG TABS (LEVOFLOXACIN) 1 tab once a day x 7 days Prescriptions: LEVAQUIN 500 MG TABS (LEVOFLOXACIN) 1 tab once a day x 7 days  #7 x 0   Entered by:   Ami Bullins CMA   Authorized by:   Jacques Navy MD   Signed by:   Bill Salinas CMA on 01/27/2010   Method used:   Electronically to        [facility]* (retail)       [address]       [place], [place]  [id number]       Ph: [phone]       Fax: [phone]   RxID:   [id number]

## 2010-12-29 NOTE — Progress Notes (Signed)
Summary: LABS  Phone Note Call from Patient   Summary of Call: Patient picked up a copy of her labs and wants to know why lipids were not checked. Can she have labs?   Patient also wants a copy of her bone density report.  Initial call taken by: Lamar Sprinkles, CMA,  August 25, 2010 11:24 AM  Follow-up for Phone Call        labs were ordered for specific follow-up. May have lipid panel at any time. 272.4  Copy of DXA printed.  Follow-up by: Jacques Navy MD,  August 25, 2010 1:11 PM  Additional Follow-up for Phone Call Additional follow up Details #1::        Left detailed vm for patient, DXA mailed Additional Follow-up by: Lamar Sprinkles, CMA,  August 26, 2010 9:43 AM

## 2010-12-29 NOTE — Assessment & Plan Note (Signed)
Summary: fever with coughing and mucous production/ ab   Vital Signs:  Patient profile:   65 year old female Height:      67 inches Weight:      206 pounds BMI:     32.38 O2 Sat:      97 % on Room air Temp:     99.0 degrees F oral Pulse rate:   90 / minute BP sitting:   132 / 94  (left arm) Cuff size:   regular  Vitals Entered By: Bill Salinas CMA (January 19, 2010 11:38 AM)  O2 Flow:  Room air CC: pt here with c/o coughing, fever with mucous production (greenish, yellow)/ ab   Primary Care Provider:  Lamberto Dinapoli  CC:  pt here with c/o coughing, fever with mucous production (greenish, and yellow)/ ab.  History of Present Illness: patient presents for URI with pressure in ears and pressure in her ears, low grade fevers to 100.1, cough productive of a pale mucus, not bitter or metallic in taste, sratchy throat that resolved, not short of breath but her lungs burned with exertion. No NM/V/D. Taking ASA, mucinex, nyquil, afrin nasal spray. She has been using nasal irrigation.   Current Medications (verified): 1)  Levothyroxine Sodium 50 Mcg  Tabs (Levothyroxine Sodium) .Marland Kitchen.. 1 By Mouth Once Daily 2)  Triamterene-Hctz 75-50 Mg  Tabs (Triamterene-Hctz) .Marland Kitchen.. 1 By Mouth Once Daily 3)  Multivitamins   Tabs (Multiple Vitamin) .Marland Kitchen.. 1 By Mouth Once Daily 4)  Adult Aspirin Low Strength 81 Mg  Tbdp (Aspirin) .... Take 1 Tablet By Mouth Once A Day 5)  Fish Oil 1200 Mg Caps (Omega-3 Fatty Acids) .... Take 1 Tablet By Mouth Two Times A Day  Allergies (verified): 1)  ! Zocor 2)  ! * Antibiotics 3)  ! Sulfa 4)  ! Epinephrine 5)  ! Codeine 6)  ! Enalapril Maleate (Enalapril Maleate) 7)  ! 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) HYPOTHYROIDISM (ICD-244.9) HYPERTENSION (ICD-401.9) HYPERLIPIDEMIA (ICD-272.4) DEPRESSION  (ICD-311) BREAST CANCER, HX OF (ICD-V10.3) APPENDICITIS jAN '09     Past Surgical History: Last updated: 02/21/2008 * Hx of POLIO RELATED SURGERIES BREAST BIOPSY, HX OF (ICD-V15.9) * Hx of HYSTERECTOMY * Hx of LUMPECTOMY RIGHT BREAST Appendectomy jAN '09  Family History: Last updated: 10/10/2007 non-contributory  Social History: Last updated: 10/10/2007 married SO- retired Optician, dispensing, 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  The patient denies weight loss, weight gain, decreased hearing, chest pain, syncope, dyspnea on exertion, headaches, abdominal pain, hematochezia, muscle weakness, transient blindness, difficulty walking, unusual weight change, and enlarged lymph nodes.    Physical Exam  General:  alert, well-developed, well-nourished, and normal appearance.   Head:  minimal sinus tenderness to percussion Eyes:  pupils equal, pupils round, corneas and lenses clear, and no injection.   Ears:  EACs clear and TMs normal Mouth:  posterior phyarnx clear Neck:  full ROM and no thyromegaly.   Lungs:  No labored respirations, no accessory muscle use, coarse rhonchi  both bases, no wheezing.  Heart:  normal rate, regular rhythm, no murmur, and no JVD.   Abdomen:  soft, non-tender, no masses, and no guarding.   Msk:  normal ROM, no joint swelling, no joint warmth, and no joint deformities.   Pulses:  2+ pulses Neurologic:  alert & oriented X3, cranial nerves II-XII intact, and strength normal in all extremities.     Impression & Recommendations:  Problem # 1:  ACUTE BRONCHITIS (ICD-466.0) patient with probable acute bronchitis with minimal elevation in WBC at 11.8 but no left shift and chest x-ray with mild bronchitic changes.  Plan - supportive care           given her multiple antibiotic allergies will hold off on antibiotics. For high fever or purulent sputum will  treat with levaquin.   Problem # 2:  GOUT, UNSPECIFIED (ICD-274.9)  Uric acid with minimal elevation of 7.8  If she has 2+ events/12 months will add allopurionol. Orders: TLB-Uric Acid, Blood (84550-URIC)  Patinet report 4+ episodes a year of gout  Her updated medication list for this problem includes:    Allopurinol 300 Mg Tabs (Allopurinol) .Marland Kitchen... 1 by mouth once daily for gout  Complete Medication List: 1)  Levothyroxine Sodium 50 Mcg Tabs (Levothyroxine sodium) .Marland Kitchen.. 1 by mouth once daily 2)  Triamterene-hctz 75-50 Mg Tabs (Triamterene-hctz) .Marland Kitchen.. 1 by mouth once daily 3)  Multivitamins Tabs (Multiple vitamin) .Marland Kitchen.. 1 by mouth once daily 4)  Adult Aspirin Low Strength 81 Mg Tbdp (Aspirin) .... Take 1 tablet by mouth once a day 5)  Fish Oil 1200 Mg Caps (Omega-3 fatty acids) .... Take 1 tablet by mouth two times a day 6)  Allopurinol 300 Mg Tabs (Allopurinol) .Marland Kitchen.. 1 by mouth once daily for gout  Other Orders: TLB-CBC Platelet - w/Differential (85025-[id number]) T-2 View CXR ([id number])  Patient: [name] Note: All result statuses are Final unless otherwise noted.  Tests: (1) CBC Platelet w/Diff (CBCD)   White Cell Count     [H]  11.8 K/uL                   4.5-10.5   Red Cell Count            4.27 Mil/uL                 3.87-5.11   Hemoglobin                13.0 g/dL                   16.1-09.6   Hematocrit                38.7 %                      36.0-46.0   MCV                       90.8 fl                     78.0-100.0   MCHC                      33.5 g/dL                   04.5-40.9   RDW                       12.4 %                      11.5-14.6   Platelet Count            227.0 K/uL  150.0-400.0   Neutrophil %              75.3 %                      43.0-77.0   Lymphocyte %              14.2 %                      12.0-46.0   Monocyte %                8.0 %                       3.0-12.0   Eosinophils%              2.5 %                       0.0-5.0    Basophils %               0.0 %                       0.0-3.0   Neutrophill Absolute [H]  8.9 K/uL                    1.4-7.7   Lymphocyte Absolute       1.7 K/uL                    0.7-4.0   Monocyte Absolute         0.9 K/uL                    0.1-1.0  Eosinophils, Absolute                             0.3 K/uL                    0.0-0.7   Basophils Absolute        0.0 K/uL                    0.0-0.1  Tests: (2) Uric Acid (URIC)   Uric Acid            [H]  7.8 mg/dL                   1.6-1.0 DG CHEST 2 VIEW - [id number]   Clinical Data: Cough.   CHEST - 2 VIEW   Comparison: Chest CT 02/05/2008   Findings: Heart is borderline in size.  There is tortuosity of the thoracic aorta.  Mild peribronchial thickening.  Bibasilar densities, likely scarring or atelectasis.  No effusions or acute bony abnormality.   IMPRESSION: Mild bronchitic changes.   Bibasilar scarring or atelectasis.   Read By:  Charlett Nose,  M.D.  Patient Instructions: 1)  upper respiratory infection - paln CXR and complete blood count. Take Robitussin DM 1 tsp every 6 hours, take sudafed 30mg  three times a day, take tylenol for fever 500mg  every 6 hours; drink a lot of fluids and continue Vitamin C. If the white count is up or if the CXR indicates infection. Prescriptions: ALLOPURINOL 300 MG TABS (ALLOPURINOL) 1 by mouth once daily for gout  #30 x 12   Entered and Authorized by:   Rosalyn Gess  Urian Martenson MD   Signed by:   Jacques Navy MD on 01/19/2010   Method used:   Electronically to        [facility]* (retail)       [address]       [place], [place]  [id number]       Ph: [phone]       Fax: [phone]   RxID:   [id number]

## 2010-12-29 NOTE — Progress Notes (Signed)
Summary: RESULTS  Phone Note Call from Patient Call back at Home Phone [phone]   Caller: Patient Call For: Jacques Navy MD Summary of Call: Pt calling regarding bone density test done 8/29. If results available pt would like to have them. Please advise. Initial call taken by: Verdell Face,  August 13, 2010 12:17 PM  Follow-up for Phone Call        Results in EMR. Please advise.  Follow-up by: Lamar Sprinkles, CMA,  August 13, 2010 1:32 PM  Additional Follow-up for Phone Call Additional follow up Details #1::        called pt - DXA normal density  Having buttock pain. 1) OV tomorrow for trigger point injection 2) rfer to Dr. Dallas Schimke for diagnostic eval.  Additional Follow-up by: Jacques Navy MD,  August 13, 2010 4:28 PM  New Problems: LOW BACK PAIN, ACUTE (ICD-724.2)   New Problems: LOW BACK PAIN, ACUTE (ICD-724.2)

## 2010-12-29 NOTE — Progress Notes (Signed)
  Phone Note Call from Patient   Caller: Patient Call For: Jacques Navy MD Reason for Call: Acute Illness Summary of Call: patinet sent e-mail about on going pain in hips and back. She has seen Dr. Lajoyce Corners who prescribed nabumetone which is not helping. Left phone msg: need additional studies if she is having on-gong pain before being able to prescribe any additional medication. Requested that she call back to triage. Initial call taken by: Jacques Navy MD,  July 31, 2010 9:12 AM

## 2010-12-29 NOTE — Letter (Signed)
Summary: [facility]  [facility]   Imported By: Lennie Odor 11/06/2010 11:13:50  _____________________________________________________________________  External Attachment:    Type:   Image     Comment:   External Document

## 2010-12-29 NOTE — Assessment & Plan Note (Signed)
Summary: 45 MIN ACUTE LOW BACK PAIN/CLE   Vital Signs:  Patient profile:   65 year old female Height:      67 inches Weight:      206 pounds BMI:     32.38 Temp:     98.7 degrees F oral Pulse rate:   86 / minute Pulse rhythm:   regular BP sitting:   120 / 72  (left arm) Cuff size:   large  Vitals Entered By: Benny Lennert CMA Duncan Dull) (August 27, 2010 10:53 AM)  Primary Care Provider:  [facility]   History of Present Illness: Chief complaint low back pain per dr Debby Bud  Dr. Arthur Holms has requested a consultation on this 65 year old female with a history of polio for evaluation of back pain and buttocks pain.   on July 02, 2010, the patient fell down a flight of stairs and developed a great deal  of low back pain. Since then, she went to see Dr. Lajoyce Corners at [facility] on July 08, 2010. the patient had no fracture at that time  per her report. As well, the patient had  lumbar spine films done,  with  minimal to no osteoarthritic changes, but there are no  films for my independent review.  At that point she was put on a steroid dose pack. She also subsequently has seen Dr. Arthur Holms twice and had a RIGHT sided SI joint injection, and later had a  trigger point injection  on the RIGHT side in what sounds to be her  lateral buttocks musculature and abductors.  At this point, her primary area of complaint is  buttocks and SI joint pain.  She also has some left-sided  foot pain.  History is significant for polio, and the patient has deficits on her LEFT foot, status post 2 different operations on the LEFT foot, with loss of extensor function on  digits 2 through 4 on the LEFT, as well she has minimal range of motion  in her true ankle joint  and has loss of strength.   She has a dramatic leg length discrepancy with her LEFT being significantly shorter than her RIGHT. 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 at this point, her primary goal is to return back and be able  to play tennis  and have diminished pain.  Even since the leg, lateral -- medial foot pain, and also having some posterior buttocks pain     Allergies: 1)  ! Zocor 2)  ! * Antibiotics 3)  ! Sulfa 4)  ! Epinephrine 5)  ! Codeine 6)  ! Enalapril Maleate (Enalapril Maleate) 7)  ! Taxol  Past History:  Past medical, surgical, family and social histories (including risk factors) reviewed, and no changes noted (except as noted below).  Past Medical History: Reviewed history from 02/21/2008 and no changes required. 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 * Hx of POLIO RELATED SURGERIES BREAST BIOPSY, HX OF (ICD-V15.9) * Hx of HYSTERECTOMY * Hx of LUMPECTOMY RIGHT BREAST Appendectomy jAN '09  Family History: Reviewed history from 10/10/2007 and no changes required. non-contributory  Social History: Reviewed history from 10/10/2007 and no changes required. 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 Bone and joint complaints as above. Denies any true radiculopathy. No additional paresthesias or alteration in  sensation her neuropathic function. Otherwise, full review of systems been performed and is otherwise negative unless noted positive.  Physical Exam  General:  loverweight white female in no acute distress, well-nourished and well-hydrated.   Head:  normocephalic and atraumatic.  no abnormalities observed.   Ears:  no external deformities.   Nose:  no external deformity.   Lungs:  normal respiratory effort.   Msk:   gait: Quite significant pelvic motion  noted on gait with LEFT and RIGHT alteration  in height  significant, and on the LEFT, the patient does have extreme supination.  The outer corner of her shoes dramatically  worn down compared  to the other shoe.  LEFT foot is  multiple sizes smaller with contractures compared to the RIGHT.  bilateral hip exam: Full range of motion with flexion, abduction, internal and external rotation bilaterally. Nontender the greater trochanteric bursa.  RIGHT SI joint tender to palpation.  hip flexion strength 5/5 bilaterally Hip abduction 4+/5 on RIGHT compared to 5/5 on LEFT  Notably tender to palpation laterally on the RIGHT in and around the insertion  at gluteus medius, and additionally markedly tender at the piriformis.  LEFT foot and ankle: Compared to RIGHT, notably shorter in length, extensor function at  phalanges 2-5 is essentially nonexistent. Tender to palpation along posterior tibialis  tendon on the LEFT.  Nontender at talus, navicular, cuboid, all metatarsals, medial and lateral malleoli.  stable anterior drawer and subtalar tilt testing.  Markedly diminished range of motion at the ankle joint in all planes of motion  on the LEFT. Pulses:  DP and PT 2+ Extremities:  no edema Neurologic:  alert & oriented X3.  sensation to gross touch is minimally diminished on the LEFT, but  compaired the patient's baseline, per her report, this is  normal and neutral without any gross loss of function. Skin:  prior surgical scars noted on the LEFT ankle Psych:  Cognition and judgment appear intact. Alert and cooperative with normal attention span and concentration. No apparent delusions, illusions, hallucinations Additional Exam:  AP pelvis and SI joint films are reviewed. Angulation of AP pelvis is unusual, however,,  there is minimal osteoarthritic changes at the true hip joint.  SI joint integrity is preserved on SI films are dedicated. No occult fracture.  Grossly unremarkable films.   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 Coupled with gait disturbance and loss of function at the LEFT foot post polio,, this will set the patient up for long-term  SI joint and buttocks pain problems.  At the same time, post trauma, she's also develops a tibialis tendinitis and a complex  post-polio foot case.  This is also causing her her some pain, and both are limiting her function and limiting her ability to play tennis.  Recommendations: partially correct the patient's leg length discrepancy.. She prefers internal correction in an orthotic if possible,  and has been unable to tolerate exterior corrections in the past. I do not think that ultimately a complete adequate  internal correction can be made,, and discuss this with her.  We can make some correction,  which I hope will help.  Some degree of muscle spasm, and there may be some bursitis in one of the many  verses in and around the lateral hip.  Certainly weaker on the RIGHT side,  and the patient was given  a video rehabilitation protocol..  I agree with the SI joint injection trial and trigger point injection trial, both of which were good ideas.  After the patient has had a trial with her simple correction in her athletic shoe, I'm going to bring her back and make  some custom orthotics  for her feet.  cc: Dr. Debby Bud  >45 minutes spent in total face to face time with the patient with >50% of time spent in counselling: as above, discussion of anatomy, diagnosis, rehab, adjustments  Problem # 2:  TIBIALIS TENDINITIS (ICD-726.72) as above,  postpolio,  for tennis, place the patient in an ASO ankle brace, which can be done intermittently now to unload her medial support  structures.  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  Her updated medication list for this problem includes:    Adult Aspirin Low Strength 81 Mg Tbdp (Aspirin) .Marland Kitchen... Take 1 tablet by mouth once a day    Nabumetone 750 Mg Tabs (Nabumetone) .Marland Kitchen... 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 1 by mouth once daily 2)  Triamterene-hctz 75-50 Mg Tabs (Triamterene-hctz) .Marland Kitchen.. 1 by mouth once daily 3)  Multivitamins Tabs (Multiple vitamin) .Marland Kitchen.. 1 by mouth once daily 4)  Adult Aspirin Low Strength 81 Mg Tbdp (Aspirin) .... Take 1 tablet by mouth once a day 5)  Fish Oil 1200 Mg Caps (Omega-3 fatty acids) .... Take 1 tablet by mouth two times a day 6)  Allopurinol 300 Mg Tabs (Allopurinol) .Marland Kitchen.. 1 by mouth once daily for gout 7)  Nabumetone 750 Mg Tabs (Nabumetone) .Marland Kitchen.. 1 tab two times a day  Patient Instructions: 1)  [email or web address] 2)  HIP EXERCISES  Current Allergies (reviewed today): ! ZOCOR ! * ANTIBIOTICS ! SULFA ! EPINEPHRINE ! CODEINE ! ENALAPRIL MALEATE (ENALAPRIL MALEATE) ! TAXOL

## 2010-12-29 NOTE — Progress Notes (Signed)
  Phone Note Outgoing Call   Reason for Call: Discuss lab or test results Summary of Call: please call patient-xrays normal: no fracture of pelvis, no abnormality at SI joints.  Initial call taken by: Jacques Navy MD,  July 24, 2010 7:51 AM  Follow-up for Phone Call        informed pt of results. Pt will call if  pain comes back Follow-up by: Ami Bullins CMA,  July 24, 2010 8:56 AM

## 2011-02-01 ENCOUNTER — Encounter: Payer: Self-pay | Admitting: Internal Medicine

## 2011-02-01 ENCOUNTER — Ambulatory Visit (INDEPENDENT_AMBULATORY_CARE_PROVIDER_SITE_OTHER): Payer: Medicare Other | Admitting: Internal Medicine

## 2011-02-01 ENCOUNTER — Other Ambulatory Visit: Payer: Self-pay | Admitting: Internal Medicine

## 2011-02-01 DIAGNOSIS — M461 Sacroiliitis, not elsewhere classified: Secondary | ICD-10-CM

## 2011-02-01 DIAGNOSIS — M109 Gout, unspecified: Secondary | ICD-10-CM

## 2011-02-01 DIAGNOSIS — I10 Essential (primary) hypertension: Secondary | ICD-10-CM

## 2011-02-01 DIAGNOSIS — M48061 Spinal stenosis, lumbar region without neurogenic claudication: Secondary | ICD-10-CM | POA: Insufficient documentation

## 2011-02-01 DIAGNOSIS — E039 Hypothyroidism, unspecified: Secondary | ICD-10-CM

## 2011-02-01 DIAGNOSIS — E785 Hyperlipidemia, unspecified: Secondary | ICD-10-CM

## 2011-02-02 ENCOUNTER — Telehealth: Payer: Self-pay | Admitting: Internal Medicine

## 2011-02-09 NOTE — Progress Notes (Signed)
  Phone Note Other Incoming   Summary of Call: pts rx was sent into [facility] on [address] I will call [facility] for this prescription.  Initial call taken by: Ami Bullins CMA,  February 02, 2011 1:09 PM    Prescriptions: GABAPENTIN 100 MG CAPS (GABAPENTIN) upward taper as presribed in instructions.  #50 x 0   Entered by:   Bill Salinas CMA   Authorized by:   Jacques Navy MD   Signed by:   Bill Salinas CMA on 02/02/2011   Method used:   Electronically to        [facility]* (retail)       [address]       [place], [place]  [id number]       Ph: [phone]       Fax: [phone]   RxID:   [id number]

## 2011-02-09 NOTE — Assessment & Plan Note (Signed)
Summary: hip & back pain/# last ov Nov 11/cd   Vital Signs:  Patient profile:   65 year old female Height:      67 inches Weight:      206 pounds BMI:     32.38 O2 Sat:      97 % on Room air Temp:     97.6 degrees F oral Pulse rate:   81 / minute BP sitting:   126 / 82  (left arm) Cuff size:   large  Vitals Entered By: Ami Bullins CMA (February 01, 2011 10:00 AM)  O2 Flow:  Room air CC: ov to discussappt with orthopedic md/ ab   Primary Care Provider:  Norins  CC:  ov to discussappt with orthopedic md/ ab.  History of Present Illness: Mrs. [name] presents to discuss recent MRI scan and on-going pain in the right buttock and proximal leg.She has had ESI c 3 by Dr. Roseanne Reno with Dr. Audrie Lia office with no relief. She is unable to play tennis and is limited in her ambulation. She is not taking any medication.  She reports that she needs lab work for her up-coming oncology follow-up.   Current Medications (verified): 1)  Levothyroxine Sodium 50 Mcg  Tabs (Levothyroxine Sodium) .Marland Kitchen.. 1 By Mouth Once Daily 2)  Triamterene-Hctz 75-50 Mg  Tabs (Triamterene-Hctz) .Marland Kitchen.. 1 By Mouth Once Daily 3)  Allopurinol 300 Mg Tabs (Allopurinol) .Marland Kitchen.. 1 By Mouth Once Daily For Gout  Allergies (verified): 1)  ! Zocor 2)  ! * Antibiotics 3)  ! Sulfa 4)  ! Epinephrine 5)  ! Codeine 6)  ! Enalapril Maleate (Enalapril Maleate) 7)  ! 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) HYPOTHYROIDISM (ICD-244.9) HYPERTENSION (ICD-401.9) HYPERLIPIDEMIA (ICD-272.4) DEPRESSION (ICD-311) BREAST CANCER, HX OF (ICD-V10.3) APPENDICITIS jAN '09     Past Surgical History: Last updated: 02/21/2008 * Hx of POLIO RELATED SURGERIES BREAST BIOPSY, HX OF (ICD-V15.9) * Hx of HYSTERECTOMY * Hx of LUMPECTOMY RIGHT BREAST Appendectomy jAN  '09  Family History: Last updated: 10/10/2007 non-contributory  Social History: Last updated: 10/10/2007 married SO- retired Optician, dispensing, 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  The patient denies anorexia, fever, weight loss, weight gain, chest pain, syncope, dyspnea on exertion, prolonged cough, abdominal pain, severe indigestion/heartburn, muscle weakness, suspicious skin lesions, depression, abnormal bleeding, and angioedema.    Physical Exam  General:  Heavyset white woman in no acute distress Head:  normocephalic and atraumatic.   Eyes:  C&S clear Lungs:  normal respiratory effort.   Heart:  normal rate and regular rhythm.   Msk:  normal ROM, no redness over joints, and no joint deformities.   Pulses:  2+ radial Neurologic:  alert & oriented X3, cranial nerves II-XII intact, and gait normal.   Skin:  turgor normal and color normal.   Psych:  Oriented X3, normally interactive, good eye contact, and not anxious appearing.     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 She has right foraminal crowding at L2-3 due to DDD and osteophytes.  Plan - gabapentin 100mg  once daily x 3, two times a day x 3, three times a day x 3, 2 tabs three times  a day x 3 and reasses           Refer to Dr. Newell Coral for neurosurgical assessement.  Orders: Neurosurgeon Referral (Neurosurgeon)  Problem # 2:  GOUT, UNSPECIFIED (ICD-274.9) No recent flares while on allopurinol  Plan - Uric acid level.  Her updated medication list for this problem includes:    Allopurinol 300 Mg Tabs (Allopurinol) .Marland Kitchen... 1 by mouth once daily for gout  Orders: TLB-Uric Acid, Blood (84550-URIC)  Problem # 3:  BREAST CANCER, HX OF (ICD-V10.3) at patient's request obtained routine screening labs that she will  tak to her oncologist.   Complete Medication List: 1)  Levothyroxine Sodium 50 Mcg Tabs (Levothyroxine sodium) .Marland Kitchen.. 1 by mouth once daily 2)  Triamterene-hctz 75-50 Mg Tabs (Triamterene-hctz) .Marland Kitchen.. 1 by mouth once daily 3)  Allopurinol 300 Mg Tabs (Allopurinol) .Marland Kitchen.. 1 by mouth once daily for gout 4)  Gabapentin 100 Mg Caps (Gabapentin) .... Upward taper as presribed in instructions.  Other Orders: TLB-TSH (Thyroid Stimulating Hormone) (84443-TSH) TLB-T4 (Thyrox), Free [phone]) TLB-BMP (Basic Metabolic Panel-BMET) (80048-METABOL) TLB-Lipid Panel (80061-LIPID) TLB-Hepatic/Liver Function Pnl (80076-HEPATIC) TLB-CBC Platelet - w/Differential (85025-CBCD)  Patient Instructions: 1)  Spinal stenosis - MRI indicated multi-level disk disease with spinal stenosis at L2-3, L3-4. Plan - refer to Dr. Newell Coral at [facility] and spine. for pain gabapentin - titrating up the dose: 1 tqb once daily x 3, 1 tab two times a day x 3, 1 tab three times a day x3, then 2 tabs three times a day. Call if medication not tolerated or if inadequate relief. OKI to take tylenol 100mg  three times a day. May also take anti-inflammatory medication as needed.  2)  Labs today as requested - results will be mailed.  Prescriptions: GABAPENTIN 100 MG CAPS (GABAPENTIN) upward taper as presribed in instructions.  #50 x 0   Entered and Authorized by:   Jacques Navy MD   Signed by:   Jacques Navy MD on 02/01/2011   Method used:   Electronically to        [facility] [phone]* (retail)       [address]       [place], [place]  [address]       Ph: [phone]       Fax: [phone]   RxID:   [id number]    Orders Added: 1)  TLB-Uric Acid, Blood [84550-URIC] 2)  TLB-TSH (Thyroid Stimulating Hormone) [84443-TSH] 3)  TLB-T4 (Thyrox), Free [56433-IR5J] 4)  TLB-BMP (Basic Metabolic Panel-BMET) [80048-METABOL] 5)  TLB-Lipid Panel [80061-LIPID] 6)  TLB-Hepatic/Liver Function  Pnl [80076-HEPATIC] 7)  TLB-CBC Platelet - w/Differential [85025-CBCD] 8)  Neurosurgeon Referral [Neurosurgeon] 9)  Est. Patient Level III [88416]

## 2011-02-10 ENCOUNTER — Other Ambulatory Visit: Payer: Medicare Other

## 2011-02-10 LAB — CBC WITH DIFFERENTIAL/PLATELET
Eosinophils Relative: 1.6 % (ref 0.0–5.0)
HCT: 41.8 % (ref 36.0–46.0)
Hemoglobin: 14.3 g/dL (ref 12.0–15.0)
Lymphs Abs: 1.7 10*3/uL (ref 0.7–4.0)
Monocytes Relative: 8.6 % (ref 3.0–12.0)
Neutro Abs: 5.4 10*3/uL (ref 1.4–7.7)
WBC: 7.9 10*3/uL (ref 4.5–10.5)

## 2011-02-10 LAB — LIPID PANEL
Cholesterol: 283 mg/dL — ABNORMAL HIGH (ref 0–200)
Total CHOL/HDL Ratio: 5
VLDL: 30 mg/dL (ref 0.0–40.0)

## 2011-02-10 LAB — BASIC METABOLIC PANEL
BUN: 22 mg/dL (ref 6–23)
CO2: 31 mEq/L (ref 19–32)
Chloride: 98 mEq/L (ref 96–112)
Potassium: 4.7 mEq/L (ref 3.5–5.1)

## 2011-02-10 LAB — HEPATIC FUNCTION PANEL
Albumin: 4.2 g/dL (ref 3.5–5.2)
Alkaline Phosphatase: 64 U/L (ref 39–117)

## 2011-02-10 LAB — TSH: TSH: 1.57 u[IU]/mL (ref 0.35–5.50)

## 2011-02-13 ENCOUNTER — Encounter: Payer: Self-pay | Admitting: Internal Medicine

## 2011-02-17 ENCOUNTER — Telehealth: Payer: Self-pay | Admitting: Internal Medicine

## 2011-02-17 NOTE — Telephone Encounter (Signed)
Needs ov

## 2011-02-17 NOTE — Telephone Encounter (Signed)
Pt called about bloodwork results recvd in mail. Would like to discuss. Spoke w/Pt and was informed that she has only been taking Allopurinol w/gout flare-ups instead of as directed to take (1) once daily. Confirmed per VO Dr Debby Bud that she is to be taking medication as a maintenance daily for gout; it is not for acute flare-ups. Pt is aware of what a low purine diet consist of and adheres to it w/her vegetarian status.  Spoke to Pt also about cholesterol results and needing to have f/u OV to discuss. Pt stated that "she is vegetarian and has tried different medications and there's nothing else to do or talk about". Pt finally conceded and said that she would call & make f/u appt. Followed up on Rx for Allpurinol script w/[facility] and had exp date for refills extended.

## 2011-02-24 ENCOUNTER — Telehealth: Payer: Self-pay | Admitting: *Deleted

## 2011-02-24 NOTE — Telephone Encounter (Signed)
Called [facility] to check on ref. Status. I left a message for hailey to call me back. It looks like ref was made at the beginning of March and pt still waiting on appt

## 2011-02-24 NOTE — Telephone Encounter (Signed)
Refer to Dr. Dutch Quint - please notify PCCs

## 2011-02-24 NOTE — Telephone Encounter (Signed)
Spoke with [name] at [facility] and she states that their facility does not participate in either one of [name]. Pt will have to be ref somewhere else

## 2011-02-25 NOTE — Letter (Signed)
[facility] [address] [place], [place]  [address] Phone: [phone]      February 15, 2011   [facility] [address] [place], [place] [address]  RE:  LAB RESULTS  Dear  Ms. Glassburn,  The following is an interpretation of your most recent lab tests.  Please take note of any instructions provided or changes to medications that have resulted from your lab work.  ELECTROLYTES:  Good - no changes needed  KIDNEY FUNCTION TESTS:  Good - no changes needed  LIVER FUNCTION TESTS:  Good - no changes needed  Health professionals look at cholesterol as more involved than just the total cholesterol. We consider the level of LDL (bad) cholesterol, HDL (good), cholesterol, and Triglycerides (Grease) in the blood.  1. Your LDL should be under 100, and the HDL should be over 45, if you have any vascular disease such as heart attack, angina, stroke, TIA (mini stroke), claudication (pain in the legs when you walk due to poor circulation),  Abdominal Aortic Aneurysm (AAA), diabetes or prediabetes.  2. Your LDL should be under 130 if you have any two of the following:     a. Smoke or chew tobacco,     b. High blood pressure (if you are on medication or over 140/90 without medication),     c. Female gender,    d. HDL below 40,    e. A female relative (father, brother, or son), who have had any vascular event          as described in #1. above under the age of 60, or a female relative (mother,       sister, or daughter) who had an event as described above under age 47. (An HDL over 60 will subtract one risk factor from the total, so if you have two items in # 2 above, but an HDL over 60, you then fall into category # 3 below).  3. Your LDL should be under 160 if you have any one of the above.  Triglycerides should be under 200 with the ideal being under 150.  For diabetes or pre-diabetes, the ideal HgbA1C should be under 6.0%.  If you fall into any of the above categories,  you should make a follow up appointment to discuss this with your physician.  LIPID PANEL:  Abnormal - schedule a follow-up appointment Triglyceride: 150.0   Cholesterol: 283   LDL: DEL   HDL: 51.50   Chol/HDL%:  5  THYROID STUDIES:  Thyroid studies normal TSH: 1.57     DIABETIC STUDIES:  Good - no changes needed Blood Glucose: 80    CBC:  Good - no changes needed Uric acid is elevated despite allopurinol.   Thyroid functions are normal. Cholesterol is very high. We will need to talk about medical treatment for the cholesterol. Also need to review a low purine diet in order to get the uric acid down vs changing medication.    Sincerely Yours,    Jacques Navy MD  Patient: [name] Note: All result statuses are Final unless otherwise noted.  Tests: (1) Uric Acid (URIC)   Uric Acid            [H]  8.9 mg/dL                   4.3-3.2  Tests: (2) TSH (TSH)   FastTSH  1.57 uIU/mL                 0.35-5.50  Tests: (3) T4, Free (FT4R)   Free T4                   1.03 ng/dL                  0.60-1.60  Tests: (4) BMP (METABOL)   Sodium                    138 mEq/L                   135-145   Potassium                 4.7 mEq/L                   3.5-5.1   Chloride                  98 mEq/L                    96-112   Carbon Dioxide            31 mEq/L                    19-32   Glucose                   80 mg/dL                    98-11   BUN                       22 mg/dL                    9-14   Creatinine                1.2 mg/dL                   7.8-2.9   Calcium                   9.6 mg/dL                   5.6-21.3   GFR                  [L]  49.59 mL/min                >60.00  Tests: (5) Lipid Panel (LIPID)   Cholesterol          [H]  283 mg/dL                   0-865     ATP III Classification            Desirable:  < 200 mg/dL                    Borderline High:  200 - 239 mg/dL               High:  > = 240 mg/dL   Triglycerides         [H]  150.0 mg/dL                 7.8-469.6     Normal:  <150 mg/dL     Borderline  High:  150 - 199 mg/dL   HDL                       81.19 mg/dL                 >14.78   VLDL Cholesterol          30.0 mg/dL                  2.9-56.2  CHO/HDL Ratio:  CHD Risk                             5                    Men          Women     1/2 Average Risk     3.4          3.3     Average Risk          5.0          4.4     2X Average Risk          9.6          7.1     3X Average Risk          15.0          11.0                           Tests: (6) Hepatic/Liver Function Panel (HEPATIC)   Total Bilirubin           0.6 mg/dL                   1.3-0.8   Direct Bilirubin          0.0 mg/dL                   6.5-7.8   Alkaline Phosphatase      64 U/L                      39-117   AST                       20 U/L                      0-37   ALT                       18 U/L                      0-35   Total Protein             6.6 g/dL                    4.6-9.6   Albumin                   4.2 g/dL                    2.9-5.2  Tests: (7) CBC Platelet w/Diff (CBCD)   White Cell Count          7.9 K/uL  4.5-10.5   Red Cell Count            4.57 Mil/uL                 3.87-5.11   Hemoglobin                14.3 g/dL                   19.1-47.8   Hematocrit                41.8 %                      36.0-46.0   MCV                       91.5 fl                     78.0-100.0   MCHC                      34.1 g/dL                   29.5-62.1   RDW                       13.4 %                      11.5-14.6   Platelet Count            220.0 K/uL                  150.0-400.0   Neutrophil %              67.6 %                      43.0-77.0   Lymphocyte %              21.5 %                      12.0-46.0   Monocyte %                8.6 %                       3.0-12.0   Eosinophils%              1.6 %                       0.0-5.0   Basophils %               0.7 %                        0.0-3.0   Neutrophill Absolute      5.4 K/uL                    1.4-7.7   Lymphocyte Absolute       1.7 K/uL                    0.7-4.0   Monocyte Absolute         0.7 K/uL  0.1-1.0  Eosinophils, Absolute                             0.1 K/uL                    0.0-0.7   Basophils Absolute        0.1 K/uL                    0.0-0.1  Tests: (8) Cholesterol LDL - Direct (DIRLDL)  Cholesterol LDL - Direct                             209.4 mg/dL     Optimal:  <782 mg/dL     Near or Above Optimal:  100-129 mg/dL     Borderline High:  956-213 mg/dL     High:  086-578 mg/dL     Very High:  >469 mg/dL

## 2011-02-25 NOTE — Telephone Encounter (Signed)
Pt needs to be ref to Dr Dutch Quint since [facility] will not taken her insurance

## 2011-02-26 ENCOUNTER — Ambulatory Visit: Payer: Medicare Other | Admitting: Internal Medicine

## 2011-03-04 ENCOUNTER — Ambulatory Visit (INDEPENDENT_AMBULATORY_CARE_PROVIDER_SITE_OTHER): Payer: Medicare Other | Admitting: Internal Medicine

## 2011-03-04 VITALS — BP 128/78 | HR 85 | Temp 98.6°F

## 2011-03-04 DIAGNOSIS — M76829 Posterior tibial tendinitis, unspecified leg: Secondary | ICD-10-CM

## 2011-03-04 DIAGNOSIS — M109 Gout, unspecified: Secondary | ICD-10-CM

## 2011-03-04 DIAGNOSIS — M79609 Pain in unspecified limb: Secondary | ICD-10-CM

## 2011-03-04 DIAGNOSIS — E785 Hyperlipidemia, unspecified: Secondary | ICD-10-CM

## 2011-03-04 MED ORDER — COLESEVELAM HCL 3.75 G PO PACK: 3.7500 g | PACK | ORAL | Status: DC

## 2011-03-04 NOTE — Progress Notes (Signed)
  Subjective:    Patient ID: [name], female    DOB: [birth year], 65 y.o.   MRN: [id number]  HPI Patient returns for follow-up: she has seen a real improvement in her back pain since using a lift in her left shoe. She has not yet been seen by NS for spinal stenosis due to DDD. She is hoping to get more improvement with further treatment by a sports medicine expert.  Recent lab with elevated uric acid at 8.9. She was not taking allopurinol regularly due to misunderstanding about dosing. She is now taking it regularly.  Recent lab revealed an LDL of 209!! She has failed all statins due to intolerance and zetia did not work.    Review of Systems  Constitutional: Negative.  Negative for fever, activity change and fatigue.  HENT: Negative.   Eyes: Negative.   Respiratory: Negative.   Cardiovascular: Negative.   Gastrointestinal: Negative.   Musculoskeletal: Positive for back pain.       Sciatic type pain affecting the right leg which is not improved using a lift in her shoe.   Neurological: Negative.   Hematological: Negative.        Objective:   Physical Exam  Vitals reviewed. Constitutional: She is oriented to person, place, and time.       Overweight white woman in no acute distress.  HENT:  Head: Normocephalic and atraumatic.  Eyes: Conjunctivae and EOM are normal.  Cardiovascular: Normal rate and regular rhythm.   Pulmonary/Chest: Effort normal and breath sounds normal.  Musculoskeletal: Normal range of motion.  Neurological: She is alert and oriented to person, place, and time. She has normal strength and normal reflexes.       Normal gait.          Assessment & Plan:  1. DDD with sciatic type pain. Now doing better after using a heel lift left shoe.  Plan - refer to Dr. Roanna Epley, et.al. For consultation           Continue with referral to Dr. Dutch Quint, NS, for evaluation even though not a candidate for surgery at this time.  2. Gout - very high uric acid level  at last lab. She has started taking allopurinol on a regular basis.  Plan - will need a repeat uric acid level at next blood draw.  3. Hyperlipidemia - very high LDL. History of intolerance of statin drugs.  Plan - trial of  Welchol powder once a day.           Lipid profile in 4 weeks.

## 2011-03-07 ENCOUNTER — Encounter: Payer: Self-pay | Admitting: Internal Medicine

## 2011-03-11 ENCOUNTER — Other Ambulatory Visit: Payer: Self-pay | Admitting: Internal Medicine

## 2011-03-16 ENCOUNTER — Ambulatory Visit: Payer: Medicare Other | Admitting: Family Medicine

## 2011-03-25 ENCOUNTER — Telehealth: Payer: Self-pay

## 2011-03-25 NOTE — Telephone Encounter (Signed)
The medication was gabapentin 100mg ; titration - 1 qhs x 5, 1 bid x 5, 1 tid x 5, 2 tabs tid reassess effect on pain.# 60. She should call back when on 2 tabs tid for at least several days.

## 2011-03-25 NOTE — Telephone Encounter (Signed)
Pt called stating she was to re-start on her medications but does not know the directions. Pt did not give mane of medication but does say she emailed MEN with this question as well. Please call

## 2011-03-26 MED ORDER — GABAPENTIN 100 MG PO CAPS: 100.0000 mg | ORAL_CAPSULE | ORAL | Status: DC

## 2011-03-26 NOTE — Telephone Encounter (Signed)
Left detailed vm on pt's cell # per pt's request.

## 2011-04-02 ENCOUNTER — Telehealth: Payer: Self-pay | Admitting: Internal Medicine

## 2011-04-02 NOTE — Telephone Encounter (Signed)
Pt would like to discuss consult w/Dr Dutch Quint on results concerning surgery. Pt states that she is "doing Ok" w/Neurontin.

## 2011-04-02 NOTE — Telephone Encounter (Signed)
OV for discussion next week.

## 2011-04-06 NOTE — Telephone Encounter (Signed)
Please help set up for OV, [facility]

## 2011-04-07 NOTE — Telephone Encounter (Signed)
Pt set up for appt from [facility].

## 2011-04-08 ENCOUNTER — Ambulatory Visit (INDEPENDENT_AMBULATORY_CARE_PROVIDER_SITE_OTHER): Payer: Medicare Other | Admitting: Internal Medicine

## 2011-04-08 VITALS — BP 134/82 | HR 94 | Temp 97.6°F

## 2011-04-08 DIAGNOSIS — M48061 Spinal stenosis, lumbar region without neurogenic claudication: Secondary | ICD-10-CM

## 2011-04-11 NOTE — Progress Notes (Signed)
  Subjective:    Patient ID: [name], female    DOB: [birth year], 65 y.o.   MRN: [id number]  HPI [name] presents to discuss on - going back pain and limitation in activities, i.e. Playing tennis. She has been to see Dr. Dutch Quint who has recommended multiple level back surgery. Correspondence is pending. She is not sure about moving forward with surgery since she is in the main pretty functional. She does have pain, but it is manageable. She denies muscle weakness or paresthesia.  Past Medical History  Diagnosis Date  . S/P radiation therapy   . Post-polio syndrome   . Other dyspnea and respiratory abnormality   . Synovial cyst, unspecified   . Unspecified hypothyroidism   . Unspecified essential hypertension   . Other and unspecified hyperlipidemia   . Depressive disorder, not elsewhere classified   . History of breast cancer   . Appendicitis 11/2007   Past Surgical History  Procedure Date  . Polio related surgeries   . Breast biopsy   . Abdominal hysterectomy   . Breast lumpectomy   . Appendectomy 11/2007   No family history on file. History   Social History  . Marital Status: Married    Spouse Name: N/A    Number of Children: N/A  . Years of Education: N/A   Occupational History  . Homemaker    Social History Main Topics  . Smoking status: Not on file  . Smokeless tobacco: Not on file  . Alcohol Use: Not on file  . Drug Use: Not on file  . Sexually Active: Not on file   Other Topics Concern  . 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 allopurinol (ZYLOPRIM) 300 MG tablet Take 300 mg by mouth daily.        . Colesevelam HCl (WELCHOL) 3.75 G PACK Take 3.75 g by mouth 1 dose over 46 hours.  30 each  11  . gabapentin (NEURONTIN) 100 MG capsule Take 1 capsule (100 mg total) by mouth as directed. 1  qhs x 5, 1 bid x 5, 1 tid x 5, 2 tabs tid after several days call office w/update  60 capsule  0  . levothyroxine (SYNTHROID, LEVOTHROID) 50 MCG tablet Take 50 mcg by mouth daily.        Marland Kitchen MAXZIDE 75-50 MG per tablet TAKE 1 TABLET ONCE DAILY.  30 each  4       Review of Systems 10 pont review is negative    Objective:   Physical Exam WNWD white womn in no distress Pul- no increased work of breathing Cor - RRR MSK - able to ambulate without assistance, rise up from chair without assistance. Neuro- A&O x 3. CN II-XII- normal. MS- normal Upper body strength; normal Leg strength - can toe/heel walk, step up to exam. Nl DTRs       Assessment & Plan:  1. Back pain, chronic - she does have limitation in that she cannot play tennis. Her findings are not severe. Her reluctance to move directly to surgery is understandable.  Plan - neurology evaluation to rule out neurologic as opposed to orthopedic problems.

## 2011-04-13 NOTE — Assessment & Plan Note (Signed)
[facility]                            CARDIOLOGY OFFICE NOTE   [name], [name]                       MRN:          [id number]  DATE:12/04/2007                            DOB:          06-15-1946    REFERRING PHYSICIAN:  Rosalyn Gess. Norins, MD   REASON FOR CONSULTATION:  Dyspnea on exertion.   HISTORY OF PRESENT ILLNESS:  [name] is a delightful 65 year old  woman with a history of hypertension, hyperlipidemia, previous polio and  breast cancer, status post radiation, chemotherapy and lumpectomy.  She  was referred today by [name] for further evaluation of dyspnea  on exertion.   She denies any history of known cardiac disease.  She did have a regular  stress test about 15 years ago which she said was unremarkable.  She has  never had a nuclear test or cardiac catheterization.   She says last spring or summer, she started noticing that she would get  more dyspneic while playing tennis, which she really loves.  She says  she really notices this only in the hot weather.  She denies any  associated chest pain or pressure.  She has not had palpitations.  She  feels that it is most likely related to the fact that she was not very  active this summer because her husband was quite sick with tongue  cancer.  She does not believe that this has progressed since that time.  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   REVIEW OF SYSTEMS:  She denies any orthopnea.  No PND.  No lower  extremity edema.  She does snore but denies any witnessed apnea.  Has  not had significant daytime fatigue.  Remainder of the review of systems  is negative except for HPI and problem list.   Of note, in reviewing her chart, [name] has performed PFTs which  were normal.  Also a CT of the chest which showed diffuse fatty liver  but no cardiopulmonary abnormalities.   PAST MEDICAL  HISTORY:  1. Right breast cancer.      a.     Status post radiation and lumpectomy and chemotherapy.      b.     She did receive Adriamycin but did not get Herceptin.  2. Hypertension, well controlled.  3. Hyperlipidemia.  4. Obesity.  5. Hypothyroidism.  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  7. Depression/anxiety.  8. Hypothyroidism.   CURRENT MEDICATIONS:  1. Synthroid 50 mcg a day.  2. Triamterene/hydrochlorothiazide 75/50.  3. Gemfibrozil 600 b.i.d.  4. Multivitamin.  5. Flax seed oil.  6. Fish oil.  7. Aspirin 162 mg every other day.   ALLERGIES:  EPINEPHRINE, ALL ANTIBIOTICS, CODEINE and STATINS.   SOCIAL HISTORY:  She is retired.  She considers herself a piddler.  She  has never smoked and does not drink significant alcohol.  She is married  with one child.   FAMILY HISTORY:  Notable for her being one of 13 children.  There is no  history of premature coronary artery disease in siblings or parents.   PHYSICAL EXAMINATION:  VITAL SIGNS:  Blood pressure is 118/64, heart  rate 80.  GENERAL APPEARANCE:  She is very pleasant.  Ambulates around the clinic  without any respiratory difficulty.  She is in no acute distress.  HEENT:  Normal.  NECK:  Supple.  There is no JVD.  Carotids are 2+ bilaterally without  any bruits.  There is no lymphadenopathy or thyromegaly.  LUNGS:  Clear.  CARDIOVASCULAR:  PMI is nondisplaced.  She is regular.  No murmurs,  rubs, or gallops.  ABDOMEN:  Obese, nontender, nondistended.  There is no  hepatosplenomegaly, no bruits, no masses, good bowel sounds.  EXTREMITIES:  Warm with no clubbing, cyanosis, or edema.  There is good  distal pulses.  No rash.  NEUROLOGIC:  Alert and oriented x3.  Cranial nerves II-XII grossly  intact.  Moves all four extremities without difficulty.  I do not detect  asymmetrical weakness in the lower extremities.  Affect is very  pleasant.  Her voice is somewhat hoarse.   EKG shows normal sinus rhythm at a  rate of 78.  There is normal  intervals.  She does have nonspecific T-wave changes in the high lateral  wall which may be related to repolarization.  I cannot exclude ischemia.  I do not have previous EKG.   ASSESSMENT/PLAN:  Dyspnea.  I think the most important thing to do here  is rule out underlying coronary artery disease as well as possible  chemotherapy related cardiomyopathy.  We will get a treadmill Myoview  and a 2-D echocardiogram.  I, like her, suspect that a good part of her  symptoms may be related, however, to deconditioning and possible some  diastolic abnormalities.  We will await the results of her studies.  If  these are normal, would suggest an exercise program and see how she  responds to this.  If she has trouble with this, could also consider a  cardiopulmonary exercise test.  We did talk briefly about the  possibility of sleep apnea, but although her husband has this, she does  not feel that she has this significantly.   DISPOSITION:  Will see her back in one to two months to discuss the  results of her testing.     Sandra Buckles. Bensimhon, MD  Electronically Signed    DRB/MedQ  DD: 12/04/2007  DT: 12/04/2007  Job #: [id number]

## 2011-04-13 NOTE — H&P (Signed)
[name], [name]              ACCOUNT NO.:  [id number]   MEDICAL RECORD NO.:  [id number]          PATIENT TYPE:  INP   LOCATION:  5151                         FACILITY:  [facility]   PHYSICIAN:  Gabrielle Dare. Janee Morn, M.D.DATE OF BIRTH:  Jul 19, 1946   DATE OF ADMISSION:  12/16/2007  DATE OF DISCHARGE:                              HISTORY & PHYSICAL   CHIEF COMPLAINT:  Right lower quadrant abdominal pain.   HISTORY OF PRESENT ILLNESS:  The patient is a 65 year old white female  with acute onset of right lower quadrant pain since approximately 3:30  p.m. yesterday.  The pain persisted, and she came to the [facility]  emergency room for further evaluation.  She was found to have  leukocytosis at 17,500.  CT scan of the abdomen and pelvis was  performed, demonstrating very enlarged and inflamed appendix consistent  with acute appendicitis.  We were asked to evaluate for further  treatment.  She continues to have pain in the right lower quadrant.   PAST MEDICAL HISTORY:  1. Right breast cancer.  2. Hypertension.  3. Hypothyroidism.  4. Polio.   PAST SURGICAL HISTORY:  1. Right mastectomy.  2. Partial hysterectomy.  3. Left foot surgery for her polio.   SOCIAL HISTORY:  Does not drink or smoke.   ALLERGIES:  SHE CLAIMS SHE IS ALLERGIC TO ALL ANTIBIOTICS, EXCEPT FOR  LEVAQUIN.  ALSO ALLERGIC TO CODEINE, ENABLEX, EPINEPHRINE, LOTENSIN,  MYCIN, AND ALL STATIN DRUGS.   CURRENT MEDICATIONS:  1. Levothyroxine 50 mcg p.o. daily.  2. Gemfibrozil 600 mg p.o. b.i.d.  3. Triamterine/HCTZ 75/50 mg 1 p.o. daily.   REVIEW OF SYSTEMS:  Significant for the GI findings as above.  Of note,  there is no nausea or vomiting associated with this abdominal pain.  CARDIAC:  Negative.  PULMONARY:  Negative.  GU:  Negative.  MUSCULOSKELETAL:  Negative.  The remainder of the review of systems is  unremarkable.   PHYSICAL EXAMINATION:  VITAL SIGNS:  Temperature 101.7, pulse 96,  respirations 18, blood  pressure 116/70.  GENERAL:  She is awake and alert.  HEENT:  Pupils equal.  Sclerae clear with no icterus.  Oral mucosa is  moist.  NECK:  Supple.  No tenderness.  PULMONARY:  Lungs are clear to auscultation with no significant  wheezing.  CARDIAC:  Regular.  No murmurs are heard.  Impulse is palpable on the  left chest.  Distal pulses are 1+.  BREASTS:  Surgical changes on the right side.  ABDOMEN:  Right lower quadrant tenderness with guarding.  Bowel sounds  are active.  No discrete masses are noted.  No organomegaly is felt.  SKIN:  Warm and dry.  No rashes are present.  EXTREMITIES:  Postoperative changes in her left foot.  NEUROLOGIC:  She is alert and oriented x3.  She moves all extremities  well and follows commands.   LABORATORY DATA:  White blood cell count 17.5, hemoglobin 13.6.  Urinalysis negative.  Sodium 129, potassium 3.4, chloride 94, CO2 24,  BUN 12, creatinine 1.02, glucose 106.   CT scan as above.   IMPRESSION:  1. Acute appendicitis.  2. Hyponatremia.   PLAN:  We will give her IV fluids and IV antibiotics.  We are taking her  emergently to the operating room for laparoscopic appendectomy.  The  procedure risks and benefits were discussed in detail with the patient  and her husband, including the possibility of conversion to open  procedure if necessary.  They are agreeable.  We will proceed  emergently.      Gabrielle Dare Janee Morn, M.D.  Electronically Signed     BET/MEDQ  D:  12/17/2007  T:  12/17/2007  Job:  [id number]   cc:   Rosalyn Gess. Norins, MD

## 2011-04-13 NOTE — Op Note (Signed)
NAMEALMADELIA, [name]              ACCOUNT NO.:  [id number]   MEDICAL RECORD NO.:  [id number]          PATIENT TYPE:  INP   LOCATION:  2550                         FACILITY:  [facility]   PHYSICIAN:  Gabrielle Dare. Janee Morn, M.D.DATE OF BIRTH:  [birth year]   DATE OF PROCEDURE:  12/17/2007  DATE OF DISCHARGE:                               OPERATIVE REPORT   PREOPERATIVE DIAGNOSIS:  Acute appendicitis.   POSTOPERATIVE DIAGNOSIS:  Acute appendicitis.   PROCEDURE:  Laparoscopic appendectomy.   SURGEON:  Gabrielle Dare. Janee Morn, M.D.   ANESTHESIA:  General endotracheal.   HISTORY OF PRESENT ILLNESS:  [name] is a 65 year old white female  who presented to the [facility] Emergency Department with lower  abdominal pain since 3:30 p.m. yesterday.  Workup in the emergency  department demonstrated leukocytosis with white blood cell count of  17,500.  CT scan of the abdomen and pelvis was done, demonstrating a  very thickened and inflamed appendix consistent with acute appendicitis.  We were asked to evaluate for further treatment.   PROCEDURE IN DETAIL:  Informed consent was obtained.  The patient was  identified in the preop holding area and she received intravenous  antibiotics.  She was brought to the operating room and general  anesthesia was administered by the anesthesia staff.  Her abdomen was  prepped and draped in a sterile fashion.  The infraumbilical region was  infiltrated with 0.25% Marcaine.  An infraumbilical incision was made.  Subcutaneous tissues were dissected down, revealing the anterior fascia;  this was divided sharply.  The peritoneal cavity was entered under  direct vision without difficulty.  A 0 Vicryl pursestring suture was  placed around the fascial opening and the Hasson trocar was inserted  into the abdomen.  The abdomen was insufflated with carbon dioxide in a  standard fashion.  Under direct vision, an 12-mm left lower quadrant and  a 5-mm right upper quadrant port  were placed; 0.25% Marcaine was used at  all port sites.  Laparoscopic exploration revealed a very inflamed, but  not perforated appendix extending down and stuck in the pelvis.  Some  loops of ileum were gently swept away.  The appendix was gently  dissected from where it was adherent to the pelvic sidewall and it was  freed up and retracted superiorly.  The mesoappendix was then gradually  divided with the harmonic scalpel, achieving excellent hemostasis.  The  base of the appendix was then divided with the endoscopic GI stapler  with vascular load.  The abdomen was copiously irrigated.  The  mesoappendix was then cauterized in a small area due to some oozing,  achieving excellent hemostasis.  Two liters of warm saline irrigation  were used.  The irrigation fluid returned clear.  The staple line was  rechecked and remained intact.  The mesoappendix was rechecked and was  dry.  The remainder of the irrigation fluid was evacuated.  The ports  were then removed under direct vision.  Pneumoperitoneum was released.  The Hasson trocar was removed.  The infraumbilical fascia was closed by  tying the 0 Vicryl pursestring suture with care  not to trap any intra-  abdominal contents.  All 3 wounds were copiously irrigated.  The skin of  the left lower quadrant  and infraumbilical wound was closed with 4-0 Vicryl subcuticular stitch  in a running fashion and then all 3 wounds were closed with Dermabond.  Sponge, needle and instrument counts were correct.  The patient  tolerated the procedure well without apparent complication and was taken  to the recovery room in stable condition.      Gabrielle Dare Janee Morn, M.D.  Electronically Signed     BET/MEDQ  D:  12/17/2007  T:  12/17/2007  Job:  [id number]   cc:   Rosalyn Gess. Norins, MD

## 2011-04-16 NOTE — Op Note (Signed)
[name], [name] NO.:  [id number]   MEDICAL RECORD NO.:  [id number]                   PATIENT TYPE:  OIB   LOCATION:  2899                                 FACILITY:  [facility]   PHYSICIAN:  Lorre Munroe., M.D.            DATE OF BIRTH:  02-Dec-1945   DATE OF PROCEDURE:  07/16/2003  DATE OF DISCHARGE:                                 OPERATIVE REPORT   PREOPERATIVE DIAGNOSIS:  Carcinoma of the right breast, chemotherapy  planned.   POSTOPERATIVE DIAGNOSIS:  Carcinoma of the right breast, chemotherapy  planned.   OPERATION:  Implantation of a venous access port.   SURGEON:  Zigmund Daniel, M.D.   ANESTHESIA:  Local with sedation.   PROCEDURE:  After the patient was monitored and sedated and had routine  preparation of the anterior chest and neck, I liberally infused local  anesthetic under the left clavicle in the region of the deltopectoral groove  and medial to that on the anterior chest wall tissues.  I made a small  incision just below the clavicle in the deltopectoral groove and dilated the  tissues with a hemostat.  Then, with the patient in Trendelenburg position,  I accessed the subclavian vein with a single stick of a large needle and fit  in a J-wire.  After confirming its position in the atrium, I then flattened  the patient and created a small pocket for implantation of the port just  medial and inferior to the venous access incision.  I made a pocket of  adequate size to accommodate the X-Port device and I implanted that securing  it with two sutures of 3-0 Vicryl.  I passed the venous tubing through to  the venous access incision.  I then cut the venous tubing to the estimated  necessary length to reach the superior vena cava.  Then, with the patient in  Trendelenburg position, I dilated the tract over the J-wire and passed the  dilator and introducer assembly over it, confirmed intravascular position by  withdrawing blood,  and then removed the dilator.  The venous tubing passed  nicely through the introducer and stayed in place nicely when I peeled the  introducer sheath away.  I then confirmed it was in good position in the  superior vena cava just distal to the entry of the innominate vein with  fluoroscopy.  It allowed easy withdrawal of blood and easy infusion of  fluid.  After flushing it thoroughly with dilute heparin  solution, I filled the device and venous tubing with heparin concentrate and  withdrew the [facility] needle.  I closed the skin incisions with intracuticular  4-0 Vicryl and Steri-Strips and applied a Band-Aid.  The patient was  comfortable and stable throughout the procedure.  Lorre Munroe., M.D.    WB/MEDQ  D:  07/16/2003  T:  07/16/2003  Job:  [id number]   cc:   Mathis Bud, M.D.   Stacie Acres White, M.D.  [address]  [place]  [place] [address]  Fax: [phone]

## 2011-04-21 ENCOUNTER — Other Ambulatory Visit: Payer: Self-pay | Admitting: Internal Medicine

## 2011-07-06 ENCOUNTER — Telehealth: Payer: Self-pay

## 2011-07-06 NOTE — Telephone Encounter (Signed)
Patient called [facility] requesting a written rx to be sent to [facility] [phone] phone). Per pt rx just need to state RX for ortho consult. Please advise if ok to send

## 2011-07-07 ENCOUNTER — Telehealth: Payer: Self-pay | Admitting: *Deleted

## 2011-07-07 NOTE — Telephone Encounter (Signed)
Done an given to Ami

## 2011-07-07 NOTE — Telephone Encounter (Signed)
Pt requesting a referral to [facility], to address the issue of her left leg and foot turning over as she walks. Dr. Debby Bud filled out a ref for this and I faxed it to [facility] @ [phone]. Original Order sent down for scanning.

## 2011-07-19 ENCOUNTER — Ambulatory Visit (INDEPENDENT_AMBULATORY_CARE_PROVIDER_SITE_OTHER): Payer: Medicare Other | Admitting: Internal Medicine

## 2011-07-19 DIAGNOSIS — M109 Gout, unspecified: Secondary | ICD-10-CM

## 2011-07-19 MED ORDER — COLCHICINE 0.6 MG PO TABS: ORAL_TABLET | ORAL | Status: DC

## 2011-07-19 NOTE — Patient Instructions (Signed)
Gout flare - the red hot and swollen finger is classic for gout flare. Plan - take colchicine 0.6 mg and repeat in 1 hour if needed. Limit is 1.2 mg in 24 hrs. If no relief may repeat the next day. Continue to take allopuriol daily. May take 800 mg ibuprofen three times a day in addition to colchicine.   Gout Gout is an inflammatory condition (arthritis) caused by a buildup of uric acid crystals in the joints. Uric acid is a chemical that is normally present in the blood. Under some circumstances, uric acid can form into crystals in your joints. This causes joint redness, soreness, and swelling (inflammation). Repeat attacks are common. Over time, uric acid crystals can form into masses (tophi) near a joint, causing disfigurement. Gout is treatable and often preventable. CAUSES The disease begins with elevated levels of uric acid in the blood. Uric acid is produced by your body when it breaks down a naturally found substance called purines. This also happens when you eat certain foods such as meats and fish. Causes of an elevated uric acid level include:  Being passed down from parent to child (heredity).   Diseases that cause increased uric acid production (obesity, psoriasis, some cancers).   Excessive alcohol use.   Diet, especially diets rich in meat and seafood.   Medicines, including certain cancer-fighting drugs (chemotherapy), diuretics, and aspirin.   Chronic kidney disease. The kidneys are no longer able to remove uric acid well.   Problems with metabolism.  Conditions strongly associated with gout include:  Obesity.   High blood pressure.   High cholesterol.   Diabetes.  Not everyone with elevated uric acid levels gets gout. It is not understood why some people get gout and others do not. Surgery, joint injury, and eating too much of certain foods are some of the factors that can lead to gout. SYMPTOMS  An attack of gout comes on quickly. It causes intense pain with  redness, swelling, and warmth in a joint.   Fever can occur.   Often, only one joint is involved. Certain joints are more commonly involved:   Base of the big toe.   Knee.   Ankle.   Wrist.   Finger.  Without treatment, an attack usually goes away in a few days to weeks. Between attacks, you usually will not have symptoms, which is different from many other forms of arthritis. DIAGNOSIS Your caregiver will suspect gout based on your symptoms and exam. Removal of fluid from the joint (arthrocentesis) is done to check for uric acid crystals. Your caregiver will give you a medicine that numbs the area (local anesthetic) and use a needle to remove joint fluid for exam. Gout is confirmed when uric acid crystals are seen in joint fluid, using a special microscope. Sometimes, blood, urine, and X-ray tests are also used. TREATMENT There are 2 phases to gout treatment: treating the sudden onset (acute) attack and preventing attacks (prophylaxis). Treatment of an Acute Attack  Medicines are used. These include anti-inflammatory medicines or steroid medicines.   An injection of steroid medicine into the affected joint is sometimes necessary.   The painful joint is rested. Movement can worsen the arthritis.   You may use warm or cold treatments on painful joints, depending which works best for you.   Discuss the use of coffee, vitamin C, or cherries with your caregiver. These may be helpful treatment options.  Treatment to Prevent Attacks After the acute attack subsides, your caregiver may advise prophylactic  medicine. These medicines either help your kidneys eliminate uric acid from your body or decrease your uric acid production. You may need to stay on these medicines for a very long time. The early phase of treatment with prophylactic medicine can be associated with an increase in acute gout attacks. For this reason, during the first few months of treatment, your caregiver may also advise  you to take medicines usually used for acute gout treatment. Be sure you understand your caregiver's directions. You should also discuss dietary treatment with your caregiver. Certain foods such as meats and fish can increase uric acid levels. Other foods such as dairy can decrease levels. Your caregiver can give you a list of foods to avoid. HOME CARE INSTRUCTIONS  Do not take aspirin to relieve pain. This raises uric acid levels.   Only take over-the-counter or prescription medicines for pain, discomfort, or fever as directed by your caregiver.   Rest the joint as much as possible. When in bed, keep sheets and blankets off painful areas.   Keep the affected joint raised (elevated).   Use crutches if the painful joint is in your leg.   Drink enough water and fluids to keep your urine clear or pale yellow. This helps your body get rid of uric acid. Do not drink alcoholic beverages. They slow the passage of uric acid.   Follow your caregiver's dietary instructions. Pay careful attention to the amount of protein you eat. Your daily diet should emphasize fruits, vegetables, whole grains, and fat-free or low-fat milk products.   Maintain a healthy body weight.  SEEK MEDICAL CARE IF:  You have an oral temperature above 100.   You develop diarrhea, vomiting, or any side effects from medicines.   You do not feel better in 24 hours, or you are getting worse.  SEEK IMMEDIATE MEDICAL CARE IF:  Your joint becomes suddenly more tender and you have:   Chills.   An oral temperature above 100, not controlled by medicine.  MAKE SURE YOU:  Understand these instructions.   Will watch your condition.   Will get help right away if you are not doing well or get worse.  Document Released: 11/12/2000 Document Re-Released: 05/05/2010 [facility] Patient Information 2011 [place], [place].

## 2011-07-21 ENCOUNTER — Telehealth: Payer: Self-pay | Admitting: Internal Medicine

## 2011-07-21 NOTE — Telephone Encounter (Signed)
Please call patient - how is her gout/finger doing  THANKS

## 2011-07-21 NOTE — Assessment & Plan Note (Signed)
Acute flare of gout - index finger. She has been taking allopurinol.  Plan - colchicine 0.6 mg with repeat at 1 hr if needed - limit 1.2 mg / 24hrs. May repeat the next day           NSAID as supplement for pain.

## 2011-07-21 NOTE — Telephone Encounter (Signed)
Pt left Vm She continues to have a red angry knuckle but finger is getting better. She is able to move finger some but can not put pressure on finger or knuckle. Should she take another day of colcrys?

## 2011-07-21 NOTE — Telephone Encounter (Signed)
Patient informed. 

## 2011-07-21 NOTE — Telephone Encounter (Signed)
May take colcrys 0.6 up to bid as long as there is gout flare. If it doesn't clear in the next 24 hours  we will have to look for something else besides gout - OV

## 2011-07-21 NOTE — Progress Notes (Signed)
  Subjective:    Patient ID: [name], female    DOB: 11-03-1946, 65 y.o.   MRN: [id number]  HPI Mrs. Mateus presents with a 3 day history of a swollen, red, hot and exquistely painful index finger right hand. She denies any injury  I have reviewed the patient's medical history in detail and updated the computerized patient record.    Review of Systems System review is negative for any constitutional, cardiac, pulmonary, GI or neuro symptoms or complaints     Objective:   Physical Exam Vitals noted Gen'l- heavyset white woman in great discomfort Ext - index finger right with swelling, erythema, calore and VERY tender       Assessment & Plan:

## 2011-07-23 ENCOUNTER — Ambulatory Visit (INDEPENDENT_AMBULATORY_CARE_PROVIDER_SITE_OTHER)
Admission: RE | Admit: 2011-07-23 | Discharge: 2011-07-23 | Disposition: A | Discharge status: Home / Self Care | Payer: Medicare Other | Source: Ambulatory Visit | Attending: Internal Medicine | Admitting: Internal Medicine

## 2011-07-23 ENCOUNTER — Ambulatory Visit (INDEPENDENT_AMBULATORY_CARE_PROVIDER_SITE_OTHER): Payer: Medicare Other | Admitting: Internal Medicine

## 2011-07-23 DIAGNOSIS — M109 Gout, unspecified: Secondary | ICD-10-CM

## 2011-07-23 DIAGNOSIS — M79609 Pain in unspecified limb: Secondary | ICD-10-CM

## 2011-07-23 DIAGNOSIS — M79644 Pain in right finger(s): Secondary | ICD-10-CM

## 2011-07-25 NOTE — Assessment & Plan Note (Addendum)
Gout flare right index finger - improved.  Plan - x-ray to assess for joint damage.   Addendum - soft tissue swelling appreciated. No gout related erosions.                      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

## 2011-07-25 NOTE — Progress Notes (Signed)
  Subjective:    Patient ID: [name], female    DOB: 25-Jul-1946, 65 y.o.   MRN: [id number]  HPI [name] returns for follow-up of inflammed finger. This was presumed to be a gout flare. She has had considerable relief with two days of colchicine and NSAIDs. The finger remains tender and she does h=not have full range of motions - difficult to make a fist.   I have reviewed the patient's medical history in detail and updated the computerized patient record.    Review of Systems System review is negative for any constitutional, cardiac, pulmonary, GI or neuro symptoms or complaints     Objective:   Physical Exam Vitals noted- stable Ext - Index finger right hand with mild erythema, no heat and much decreased tenderness       Assessment & Plan:

## 2011-07-26 ENCOUNTER — Telehealth: Payer: Self-pay | Admitting: Internal Medicine

## 2011-07-26 NOTE — Telephone Encounter (Signed)
Pt states finger is some better, but not completely gone. She would like to know what the next step would be. Please advise

## 2011-07-26 NOTE — Telephone Encounter (Signed)
Non gouty changes, lots of DJD. Let me know if the finger does not continue to get better.

## 2011-07-26 NOTE — Telephone Encounter (Signed)
For lack of continued improvement will refer to a hand specialist

## 2011-07-27 NOTE — Telephone Encounter (Signed)
k

## 2011-07-27 NOTE — Telephone Encounter (Signed)
Informed pt for lack of improvement would refer to hand specialist. Pt doesn't think she needs referral at this time will call when she wants to proceed.

## 2011-07-28 ENCOUNTER — Other Ambulatory Visit: Payer: Self-pay | Admitting: Internal Medicine

## 2011-08-04 ENCOUNTER — Other Ambulatory Visit: Payer: Self-pay | Admitting: *Deleted

## 2011-08-04 MED ORDER — TRIAMTERENE-HCTZ 75-50 MG PO TABS: 1.0000 | ORAL_TABLET | Freq: Every day | ORAL | Status: DC

## 2011-08-19 LAB — CBC
HCT: 40.5
Hemoglobin: 13.6
MCV: 89.6
Platelets: 189
RBC: 4.52
RDW: 12.6
WBC: 17.5 — ABNORMAL HIGH

## 2011-08-19 LAB — COMPREHENSIVE METABOLIC PANEL
AST: 26
BUN: 12
CO2: 24
Chloride: 94 — ABNORMAL LOW
Creatinine, Ser: 1.02
GFR calc Af Amer: >60
GFR calc non Af Amer: 55 — ABNORMAL LOW
Glucose, Bld: 106 — ABNORMAL HIGH
Total Bilirubin: 1

## 2011-08-19 LAB — BASIC METABOLIC PANEL
BUN: 12
Calcium: 8.7
GFR calc non Af Amer: 47 — ABNORMAL LOW
Glucose, Bld: 100 — ABNORMAL HIGH
Sodium: 135

## 2011-08-19 LAB — LIPASE, BLOOD: Lipase: 21

## 2011-08-19 LAB — DIFFERENTIAL
Basophils Absolute: 0
Eosinophils Relative: 0
Lymphocytes Relative: 7 — ABNORMAL LOW
Neutrophils Relative %: 86 — ABNORMAL HIGH

## 2011-08-19 LAB — URINALYSIS, ROUTINE W REFLEX MICROSCOPIC
Bilirubin Urine: NEGATIVE
Ketones, ur: NEGATIVE
Nitrite: NEGATIVE
Specific Gravity, Urine: 1.024
Urobilinogen, UA: 0.2

## 2011-08-23 ENCOUNTER — Other Ambulatory Visit: Payer: Self-pay | Admitting: Internal Medicine

## 2011-11-24 ENCOUNTER — Telehealth: Payer: Self-pay

## 2011-11-24 NOTE — Telephone Encounter (Signed)
Pt states she has had a fever for 4 days, 101.4 - 99.5. Pt also has a productive cough as well as some mild chest tightness and sinus congestion and pressure.

## 2011-11-24 NOTE — Telephone Encounter (Signed)
Left a message for pt to call me back for further questions

## 2011-11-24 NOTE — Telephone Encounter (Signed)
Pt's spouse called requesting Rx for Levaquin. Spouse states that pt had an URI, please advise.

## 2011-11-24 NOTE — Telephone Encounter (Signed)
More information please: fever? Productive cough with sputum? SOB? Wheezing? Sinus involvement? Thanks

## 2011-11-25 MED ORDER — LEVOFLOXACIN 500 MG PO TABS: 500.0000 mg | ORAL_TABLET | Freq: Every day | ORAL | Status: AC

## 2011-11-25 NOTE — Telephone Encounter (Signed)
Preferred drug - augmentin 875 mg 1 po bid x 7 = #14

## 2011-11-25 NOTE — Telephone Encounter (Signed)
Ok for levaquin 500 mg daily x 7 days.  She reports being allergic to all antibiotics except levaquin.

## 2011-11-25 NOTE — Telephone Encounter (Signed)
Spoke with pts husband and he states the pt has had polio and can only take levequin

## 2011-12-04 ENCOUNTER — Other Ambulatory Visit: Payer: Self-pay | Admitting: *Deleted

## 2011-12-04 NOTE — Progress Notes (Signed)
Opened in error

## 2011-12-23 ENCOUNTER — Ambulatory Visit (INDEPENDENT_AMBULATORY_CARE_PROVIDER_SITE_OTHER): Payer: Medicare Other | Admitting: Internal Medicine

## 2011-12-23 ENCOUNTER — Encounter: Payer: Self-pay | Admitting: Internal Medicine

## 2011-12-23 VITALS — BP 122/82 | HR 95 | Temp 98.7°F | Resp 14

## 2011-12-23 DIAGNOSIS — J3489 Other specified disorders of nose and nasal sinuses: Secondary | ICD-10-CM

## 2011-12-23 DIAGNOSIS — R0981 Nasal congestion: Secondary | ICD-10-CM

## 2011-12-23 DIAGNOSIS — Z23 Encounter for immunization: Secondary | ICD-10-CM

## 2011-12-23 NOTE — Patient Instructions (Signed)
Sinus congestion without any convincing evidence of a bacterial infection.  Plan - take Sudafed (generic) 30 mg two or three times a day.           Mucinex 1200 mg twice a day           Irrigation           Tylenol for fever, aches           Vaporizer treatments - stovetop or otherwise           Call if you don't open up and get better; call for fevers

## 2011-12-23 NOTE — Progress Notes (Signed)
  Subjective:    Patient ID: [name], female    DOB: 04-Aug-1946, 66 y.o.   MRN: [id number]  HPI Mrs. Witthuhn presents for evaluation of sinus congestion of the left maxillary sinus with purulent, bitter, bad smelling mucus. She has not had any fever, no lumps, no SOB. Dec 27th she was treated with Levaquin for respiratory infection. She has multiple antibiotic allergies basically only able to take quinolones.  I have reviewed the patient's medical history in detail and updated the computerized patient record.    Review of Systems System review is negative for any constitutional, cardiac, pulmonary, GI or neuro symptoms or complaints other than as described in the HPI.     Objective:   Physical Exam Filed Vitals:   12/23/11 1508  BP: 122/82  Pulse: 95  Temp: 98.7 F (37.1 C)  Resp: 14   Gen'l- heavyset white woman in no acute distress HEENT - tender to percussion over the left maxillary sinus. EAC's/TMs normal. Throat clear Lungs - clear Nodes - none in the cervical area. Cor - RRR       Assessment & Plan:  Sinus congestion - no evidence on exam of a bacterial infection  Plan - supportive care including use of low dose decongestant - sudafed 30 mg bid. No indication for antibiotics.

## 2012-01-31 ENCOUNTER — Telehealth: Payer: Self-pay | Admitting: *Deleted

## 2012-01-31 DIAGNOSIS — E039 Hypothyroidism, unspecified: Secondary | ICD-10-CM

## 2012-01-31 DIAGNOSIS — I10 Essential (primary) hypertension: Secondary | ICD-10-CM

## 2012-01-31 DIAGNOSIS — M109 Gout, unspecified: Secondary | ICD-10-CM

## 2012-01-31 DIAGNOSIS — E785 Hyperlipidemia, unspecified: Secondary | ICD-10-CM

## 2012-01-31 NOTE — Telephone Encounter (Signed)
Pt calling requesting the following lab orders be drawn soon: CBC w/diff, CMP, lipid and TSH per Dr. Unk Lightning to Dr. Abbe Amsterdam.

## 2012-01-31 NOTE — Telephone Encounter (Signed)
That fax number is [phone].

## 2012-01-31 NOTE — Telephone Encounter (Signed)
Lab orders entered. Be sure we have a fax # for Dr. Abbe Amsterdam

## 2012-02-07 ENCOUNTER — Other Ambulatory Visit (INDEPENDENT_AMBULATORY_CARE_PROVIDER_SITE_OTHER): Payer: Medicare Other

## 2012-02-07 DIAGNOSIS — I10 Essential (primary) hypertension: Secondary | ICD-10-CM

## 2012-02-07 DIAGNOSIS — E039 Hypothyroidism, unspecified: Secondary | ICD-10-CM

## 2012-02-07 DIAGNOSIS — M109 Gout, unspecified: Secondary | ICD-10-CM

## 2012-02-07 DIAGNOSIS — E785 Hyperlipidemia, unspecified: Secondary | ICD-10-CM

## 2012-02-07 LAB — LIPID PANEL
HDL: 40.2 mg/dL (ref >39.00)
VLDL: 31.6 mg/dL (ref 0.0–40.0)

## 2012-02-07 LAB — COMPREHENSIVE METABOLIC PANEL
Alkaline Phosphatase: 98 U/L (ref 39–117)
Creatinine, Ser: 1 mg/dL (ref 0.4–1.2)
Glucose, Bld: 107 mg/dL — ABNORMAL HIGH (ref 70–99)
Sodium: 137 mEq/L (ref 135–145)
Total Bilirubin: 0.4 mg/dL (ref 0.3–1.2)
Total Protein: 7.1 g/dL (ref 6.0–8.3)

## 2012-02-07 LAB — CBC WITH DIFFERENTIAL/PLATELET
Basophils Absolute: 0.1 10*3/uL (ref 0.0–0.1)
Basophils Relative: 1 % (ref 0.0–3.0)
Hemoglobin: 13.9 g/dL (ref 12.0–15.0)
Lymphocytes Relative: 20.8 % (ref 12.0–46.0)
Monocytes Relative: 8.1 % (ref 3.0–12.0)
Neutro Abs: 5.5 10*3/uL (ref 1.4–7.7)
RBC: 4.72 Mil/uL (ref 3.87–5.11)
WBC: 8.4 10*3/uL (ref 4.5–10.5)

## 2012-02-07 LAB — URIC ACID: Uric Acid, Serum: 5.8 mg/dL (ref 2.4–7.0)

## 2012-02-07 LAB — HEPATIC FUNCTION PANEL
AST: 20 U/L (ref 0–37)
Albumin: 4 g/dL (ref 3.5–5.2)
Alkaline Phosphatase: 98 U/L (ref 39–117)
Bilirubin, Direct: 0.1 mg/dL (ref 0.0–0.3)

## 2012-02-07 LAB — TSH: TSH: 2.27 u[IU]/mL (ref 0.35–5.50)

## 2012-02-11 ENCOUNTER — Encounter: Payer: Self-pay | Admitting: Internal Medicine

## 2012-03-21 ENCOUNTER — Other Ambulatory Visit: Payer: Self-pay | Admitting: Otolaryngology

## 2012-03-21 ENCOUNTER — Other Ambulatory Visit (HOSPITAL_COMMUNITY)
Admission: RE | Admit: 2012-03-21 | Discharge: 2012-03-21 | Disposition: A | Discharge status: Home / Self Care | Payer: Medicare Other | Source: Ambulatory Visit | Attending: Otolaryngology | Admitting: Otolaryngology

## 2012-03-21 DIAGNOSIS — R221 Localized swelling, mass and lump, neck: Secondary | ICD-10-CM | POA: Insufficient documentation

## 2012-03-21 DIAGNOSIS — R220 Localized swelling, mass and lump, head: Secondary | ICD-10-CM | POA: Insufficient documentation

## 2012-05-16 ENCOUNTER — Other Ambulatory Visit: Payer: Self-pay | Admitting: Internal Medicine

## 2012-09-11 ENCOUNTER — Encounter: Payer: Self-pay | Admitting: Internal Medicine

## 2012-09-11 ENCOUNTER — Ambulatory Visit (INDEPENDENT_AMBULATORY_CARE_PROVIDER_SITE_OTHER): Payer: Medicare Other | Admitting: Internal Medicine

## 2012-09-11 VITALS — BP 132/88 | HR 104 | Temp 98.3°F | Resp 16 | Wt 201.0 lb

## 2012-09-11 DIAGNOSIS — I10 Essential (primary) hypertension: Secondary | ICD-10-CM

## 2012-09-11 DIAGNOSIS — Z23 Encounter for immunization: Secondary | ICD-10-CM

## 2012-09-11 MED ORDER — ZOLPIDEM TARTRATE 5 MG PO TABS: 5.0000 mg | ORAL_TABLET | Freq: Every evening | ORAL | Status: DC | PRN

## 2012-09-11 MED ORDER — CLONIDINE HCL 0.1 MG PO TABS: 0.1000 mg | ORAL_TABLET | Freq: Three times a day (TID) | ORAL | Status: DC

## 2012-09-11 MED ORDER — METOPROLOL SUCCINATE ER 25 MG PO TB24: 25.0000 mg | ORAL_TABLET | Freq: Every day | ORAL | Status: DC

## 2012-09-11 NOTE — Patient Instructions (Addendum)
Blood pressure - question of intermittent excursion vs persistent BP elevation vs both.  Plan -  Continue diuretic - Maxzide  Add Toprol XL 25 mg once a day, a beta blocker. Will help lower BP and heart rate. You may feel a bit drowsy/draggy for the first 10-14 days  Clonidine 0.1 mg for BP greater than 160 top, or 100 bottom. May repeat at 2 hours if BP is persistently elevated. This does work quickly - be careful  Sleep - Will focus on sleep hygiene:  Sleep is a learned or unlearned behavior. 5 principles of sleep hygiene - 1) regular hour to retire and rise 7days/wk 2) no stimulants - caffeine, chocolat, alcohol, 3) regular exercise  - every afternoon  4) sleep sanctuary - a space that is right light, temperature, sound level, good bed where all you do is sleep. 5) No extinction behaviors, e.g. Laying in bed awake doing anything but sleeping. This means if you have a bad night - no naps, etc  May use Zolpidem 5 mg at bedtime as needed.  Good luck with the biopsy and have me on the circulation list for reports and pathology.

## 2012-09-11 NOTE — Progress Notes (Signed)
Subjective:    Patient ID: [name], female    DOB: November 05, 1946, 66 y.o.   MRN: [id number]  HPI [name] has been having hearing issues and pressure in the ear along with an increasing mass in the submandibular area. She has had CT which reveals a mass in the nasopharygneal area that is worrisome as are the enlarged lymph nodes. She is scheduled for Dr. Jearld Fenton to do a transnasal biopsy as well as tympanostomy tube placement to relieve pressure and improve hearing. . She is obviously anxious about these problems  She reports that she has had frequent blood pressure excursions to 180-200/120. She has not had severe headache, diplopia, epistaxis, focal neurologic symptoms. She does take Maxzide daily. In the office she did have a normal blood pressure.  It is unclear if this is an intermittent phenomena or a persistently elevated pressure.   She is not sleeping well. Is familiar with the rules of sleep hygiene.  Past Medical History  Diagnosis Date  . S/P radiation therapy   . Post-polio syndrome   . Other dyspnea and respiratory abnormality   . Synovial cyst, unspecified   . Unspecified hypothyroidism   . Unspecified essential hypertension   . Other and unspecified hyperlipidemia   . Depressive disorder, not elsewhere classified   . History of breast cancer   . Appendicitis 11/2007   Past Surgical History  Procedure Date  . Polio related surgeries   . Breast biopsy   . Abdominal hysterectomy   . Breast lumpectomy   . Appendectomy 11/2007   Family History  Problem Relation Age of Onset  . COPD Mother   . Cancer Neg Hx   . Diabetes Neg Hx   . Hypertension Neg Hx   . COPD Brother   . Heart disease Brother    History   Social History  . Marital Status: Married    Spouse Name: N/A    Number of Children: 1  . Years of Education: 61   Occupational History  . Homemaker    Social History Main Topics  . Smoking status: Never Smoker   . Smokeless tobacco: Never Used  .  Alcohol Use: Yes     occasional  . Drug Use: No  . Sexually Active: No   Other Topics Concern  . Not on file   Social History Narrative   HSG, [employer], just shy of a MA in counseling. Work - Agricultural consultant, Airline pilot, corporate. SO - retired Optician, dispensing, dealing with oral cancer. Live together and are independent in ADLs. ACP - no heroics, No DNI, Yes - DNR.    Current Outpatient Prescriptions on File Prior to Visit  Medication Sig Dispense Refill  . levothyroxine (SYNTHROID, LEVOTHROID) 50 MCG tablet Take 50 mcg by mouth daily.        Marland Kitchen triamterene-hydrochlorothiazide (MAXZIDE) 75-50 MG per tablet Take 1 tablet by mouth daily.  90 tablet  3  . colchicine 0.6 MG tablet Take one dose and if no relief take a second dose 1 hour later. Maximum dose 1.2 mg in 24 hours. May repeat the next day.  20 tablet  2  . Colesevelam HCl (WELCHOL) 3.75 G PACK Take 3.75 g by mouth 1 dose over 46 hours.  30 each  11  . gabapentin (NEURONTIN) 100 MG capsule TAKE (2) CAPSULES THREE TIMES DAILY.  540 capsule  3  . ZYLOPRIM 300 MG tablet TAKE 1 TABLET EACH DAY.  90 each  1      Review  of Systems System review is negative for any constitutional, cardiac, pulmonary, GI or neuro symptoms or complaints other than as described in the HPI.     Objective:   Physical Exam Filed Vitals:   09/11/12 1526  BP: 132/88  Pulse: 104  Temp: 98.3 F (36.8 C)  Resp: 16   Wt Readings from Last 3 Encounters:  09/11/12 201 lb (91.173 kg)  02/01/11 206 lb (93.441 kg)  10/01/10 206 lb (93.441 kg)   Gen'l- heavyset white woman in no acute distress HEENT- C&S clear Neck- supple Nodes - prominent submandibular adenopathy right greater than left Cor- RRR Pulm - normal respirations. Neuro - A&O x 3, CN II-XII grossly intact.        Assessment & Plan:  Nasopahryngeal mass and adenopathy - she is seeing Dr. Jearld Fenton who is planning a transnasal biopsy, may come to sampling of lymph nodes. Concern is for lymphoma vs other.  I have asked for copies of notes and lab be sent to me.

## 2012-09-12 ENCOUNTER — Encounter: Payer: Self-pay | Admitting: Internal Medicine

## 2012-09-12 NOTE — Assessment & Plan Note (Signed)
BP excursion to very high levels although she has been asymptomatic. Frequency of excursions is not known. Her baseline BP may be up as well along with persistent tachycardia.  Plan  Continue maxzide  Add  toprolol XL 25 mg daily  For excurions of SBP >160, DBP >100 clonidine 01. Mg q 2 hrs prn.

## 2012-09-14 ENCOUNTER — Encounter: Payer: Self-pay | Admitting: Internal Medicine

## 2012-09-14 NOTE — Addendum Note (Signed)
Addended by: Elnora Morrison on: 09/14/2012 03:36 PM   Modules accepted: Orders

## 2012-09-14 NOTE — Addendum Note (Signed)
Addended by: Elnora Morrison on: 09/14/2012 03:39 PM   Modules accepted: Orders

## 2012-09-19 ENCOUNTER — Other Ambulatory Visit: Payer: Self-pay | Admitting: Otolaryngology

## 2012-09-27 ENCOUNTER — Other Ambulatory Visit (HOSPITAL_COMMUNITY): Payer: Self-pay | Admitting: Internal Medicine

## 2012-09-27 DIAGNOSIS — C8590 Non-Hodgkin lymphoma, unspecified, unspecified site: Secondary | ICD-10-CM

## 2012-10-05 ENCOUNTER — Other Ambulatory Visit (HOSPITAL_COMMUNITY): Payer: Medicare Other

## 2012-10-09 ENCOUNTER — Encounter: Payer: Self-pay | Admitting: Internal Medicine

## 2012-10-31 ENCOUNTER — Other Ambulatory Visit: Payer: Self-pay | Admitting: Internal Medicine

## 2013-01-15 ENCOUNTER — Other Ambulatory Visit: Payer: Self-pay | Admitting: Internal Medicine

## 2013-01-16 LAB — PULMONARY FUNCTION TEST

## 2013-01-21 ENCOUNTER — Encounter: Payer: Self-pay | Admitting: Internal Medicine

## 2013-02-09 ENCOUNTER — Telehealth: Payer: Self-pay | Admitting: Internal Medicine

## 2013-02-09 NOTE — Telephone Encounter (Signed)
Caller: Ariadne/Patient; Phone: [phone]; Reason for Call: Pt calling today 02/09/14 regarding really wants to talk with Dr.  Debby Bud.  She is currently taking Chemo for Mantle Cell Lymphoma.  Said she is having shortness of breath and increased heart rate which is not new, said has been going on for months.  MD should have all her reports from her cardiologist.  They have not been able to determine what is going.  Also wants to talk with MD about her BP medication because what the cardiologist told her to do is contrary to what Dr.  Debby Bud has told her to do.  Pt refuses triage assessment, said she is sick and not able to come in to office for appt because of her chemo treatments.  Only wants to talk directly with Dr.  Debby Bud.  OFFICE PLEASE HAVE MD CALL PT BACK AT [phone], SHE WANTS TO TALK DIRECTLY WITH DR.  Debby Bud.  Thanks.

## 2013-02-12 NOTE — Telephone Encounter (Signed)
Patient called:she has had increased DOE and tachycardia. She was seen by Cardiology at [facility] 2 D eco with EF 60-65%, grade I diastolic dysfunction and no wall motion abnl. She has PFTs that are supposedly normal.   She has been taken of maxzide and had metoprolol XR 25 mg reduced to 12. Mg  BP 100/60  Recommend:  1. Leave off maxzide 2. Resume full 25 mg dose of metoprolol 3. Obtain copy of PFT's

## 2013-04-16 ENCOUNTER — Encounter: Payer: Self-pay | Admitting: Internal Medicine

## 2013-04-28 ENCOUNTER — Encounter: Payer: Self-pay | Admitting: Internal Medicine

## 2013-04-30 MED ORDER — METOPROLOL SUCCINATE ER 25 MG PO TB24: 25.0000 mg | ORAL_TABLET | Freq: Every day | ORAL | Status: DC

## 2013-04-30 MED ORDER — TRIAMTERENE-HCTZ 75-50 MG PO TABS: 1.0000 | ORAL_TABLET | Freq: Every day | ORAL | Status: DC

## 2013-05-14 ENCOUNTER — Encounter: Payer: Self-pay | Admitting: Internal Medicine

## 2013-05-23 ENCOUNTER — Encounter: Payer: Self-pay | Admitting: Internal Medicine

## 2013-06-12 ENCOUNTER — Encounter: Payer: Self-pay | Admitting: Internal Medicine

## 2013-06-12 MED ORDER — ZOLPIDEM TARTRATE 5 MG PO TABS: 5.0000 mg | ORAL_TABLET | Freq: Every evening | ORAL | Status: DC | PRN

## 2013-08-13 ENCOUNTER — Other Ambulatory Visit: Payer: Self-pay

## 2013-08-13 MED ORDER — METOPROLOL SUCCINATE ER 25 MG PO TB24: 25.0000 mg | ORAL_TABLET | Freq: Every day | ORAL | Status: DC

## 2013-09-12 ENCOUNTER — Encounter: Payer: Self-pay | Admitting: Internal Medicine

## 2013-09-19 ENCOUNTER — Encounter: Payer: Self-pay | Admitting: Internal Medicine

## 2013-09-19 DIAGNOSIS — C8310 Mantle cell lymphoma, unspecified site: Secondary | ICD-10-CM

## 2013-09-19 DIAGNOSIS — Z853 Personal history of malignant neoplasm of breast: Secondary | ICD-10-CM

## 2013-09-27 DIAGNOSIS — C8310 Mantle cell lymphoma, unspecified site: Secondary | ICD-10-CM | POA: Insufficient documentation

## 2013-09-27 MED ORDER — TRAZODONE HCL 50 MG PO TABS: 50.0000 mg | ORAL_TABLET | Freq: Every evening | ORAL | Status: DC | PRN

## 2013-09-27 NOTE — Addendum Note (Signed)
Addended by: Illene Regulus E on: 09/27/2013 11:28 AM   Modules accepted: Orders

## 2013-09-27 NOTE — Telephone Encounter (Signed)
09/27/2013  Pt left message stating that he hasn't heard anything in regards to this email and another he sent on 09/19/2013.

## 2013-09-28 ENCOUNTER — Telehealth: Payer: Self-pay | Admitting: Oncology

## 2013-09-28 NOTE — Telephone Encounter (Signed)
LVOM FOR PT TO RETURN CALL IN RE TO [facility]INIC.

## 2013-10-02 ENCOUNTER — Telehealth: Payer: Self-pay | Admitting: Oncology

## 2013-10-02 NOTE — Telephone Encounter (Signed)
C/D 10/02/13 for appt. 10/16/13

## 2013-10-04 ENCOUNTER — Telehealth: Payer: Self-pay | Admitting: Oncology

## 2013-10-04 ENCOUNTER — Encounter: Payer: Self-pay | Admitting: Internal Medicine

## 2013-10-04 ENCOUNTER — Other Ambulatory Visit: Payer: Self-pay

## 2013-10-04 NOTE — Telephone Encounter (Signed)
S/w pt and gve appt for 11/25 @ 11 per md.  Calendar maile.d

## 2013-10-11 ENCOUNTER — Telehealth: Payer: Self-pay | Admitting: *Deleted

## 2013-10-11 NOTE — Telephone Encounter (Signed)
Spoke with pt advised issue has been sent to Practice Management.

## 2013-10-11 NOTE — Telephone Encounter (Signed)
Pt called states Dr Debby Bud is not listed as a Primary Care Physician with [facility].  Dr Debby Bud is listed as an Internal Medicine Specialist which will cause them to be billed a specialist copay at each visit with Dr Debby Bud and difference between $10 vs $45 copay.  Please advise

## 2013-10-11 NOTE — Telephone Encounter (Signed)
An issue for Truddie Hidden and Arlys John to address

## 2013-10-16 ENCOUNTER — Encounter: Payer: Medicare Other | Admitting: Oncology

## 2013-10-18 ENCOUNTER — Encounter: Payer: Self-pay | Admitting: Internal Medicine

## 2013-10-19 ENCOUNTER — Other Ambulatory Visit: Payer: Self-pay | Admitting: Internal Medicine

## 2013-10-22 ENCOUNTER — Telehealth: Payer: Self-pay | Admitting: Oncology

## 2013-10-22 ENCOUNTER — Encounter: Payer: Self-pay | Admitting: Internal Medicine

## 2013-10-23 ENCOUNTER — Encounter: Payer: Medicare Other | Admitting: Oncology

## 2013-10-23 ENCOUNTER — Ambulatory Visit: Payer: Medicare Other | Admitting: Oncology

## 2013-10-30 ENCOUNTER — Ambulatory Visit: Payer: Medicare Other | Admitting: Internal Medicine

## 2013-11-05 ENCOUNTER — Other Ambulatory Visit: Payer: Self-pay | Admitting: Medical Oncology

## 2014-01-07 ENCOUNTER — Telehealth: Payer: Self-pay | Admitting: Internal Medicine

## 2014-01-07 NOTE — Telephone Encounter (Signed)
If she is injured will definitely see her today.

## 2014-01-07 NOTE — Telephone Encounter (Signed)
Pt fell over the weekend and is sore.  She also needs refills.  LOV 2013.  She is on the schedule for 8:00 Tues.  Do you want to work her in today?

## 2014-01-07 NOTE — Telephone Encounter (Signed)
Called pt.  She is ok waiting for 8:00 Tues morning.

## 2014-01-08 ENCOUNTER — Ambulatory Visit (INDEPENDENT_AMBULATORY_CARE_PROVIDER_SITE_OTHER): Payer: Medicare HMO | Admitting: Internal Medicine

## 2014-01-08 ENCOUNTER — Encounter: Payer: Self-pay | Admitting: Internal Medicine

## 2014-01-08 VITALS — BP 110/70 | HR 73 | Temp 98.7°F

## 2014-01-08 DIAGNOSIS — C8319 Mantle cell lymphoma, extranodal and solid organ sites: Secondary | ICD-10-CM

## 2014-01-08 DIAGNOSIS — I10 Essential (primary) hypertension: Secondary | ICD-10-CM

## 2014-01-08 DIAGNOSIS — C8310 Mantle cell lymphoma, unspecified site: Secondary | ICD-10-CM

## 2014-01-08 DIAGNOSIS — F329 Major depressive disorder, single episode, unspecified: Secondary | ICD-10-CM

## 2014-01-08 DIAGNOSIS — B91 Sequelae of poliomyelitis: Secondary | ICD-10-CM

## 2014-01-08 DIAGNOSIS — F3289 Other specified depressive episodes: Secondary | ICD-10-CM

## 2014-01-08 MED ORDER — TRIAMTERENE-HCTZ 75-50 MG PO TABS: 1.0000 | ORAL_TABLET | Freq: Every day | ORAL | Status: DC

## 2014-01-08 MED ORDER — LEVOTHYROXINE SODIUM 50 MCG PO TABS: 50.0000 ug | ORAL_TABLET | Freq: Every day | ORAL | Status: DC

## 2014-01-08 MED ORDER — METOPROLOL SUCCINATE ER 25 MG PO TB24: 25.0000 mg | ORAL_TABLET | Freq: Every day | ORAL | Status: DC

## 2014-01-08 NOTE — Progress Notes (Signed)
Subjective:     Patient ID: [name], female   DOB: 1946/11/08, 68 y.o.   MRN: [id number]  HPI  Patient had a fall and injured R knee on Saturday. Was walking dogs on the asphalt path and fell. Knee hit the asphalt. Hurts when bending or straightening leg. Pain around the kneecap. Pain at a 10/10 severity when she moves it. Tried rest, knee brace without relief. No numbness / tingling. Has avoided exercise until this visit.  Past Medical History  Diagnosis Date  . S/P radiation therapy   . Post-polio syndrome   . Other dyspnea and respiratory abnormality   . Synovial cyst, unspecified   . Unspecified hypothyroidism   . Unspecified essential hypertension   . Other and unspecified hyperlipidemia   . Depressive disorder, not elsewhere classified   . History of breast cancer   . Appendicitis 11/2007   Past Surgical History  Procedure Laterality Date  . Polio related surgeries    . Breast biopsy    . Abdominal hysterectomy    . Breast lumpectomy    . Appendectomy  11/2007   Family History  Problem Relation Age of Onset  . COPD Mother   . Cancer Neg Hx   . Diabetes Neg Hx   . Hypertension Neg Hx   . COPD Brother   . Heart disease Brother    History   Social History  . Marital Status: Married    Spouse Name: N/A    Number of Children: 1  . Years of Education: 14   Occupational History  . Homemaker    Social History Main Topics  . Smoking status: Never Smoker   . Smokeless tobacco: Never Used  . Alcohol Use: Yes     Comment: occasional  . Drug Use: No  . Sexual Activity: No   Other Topics Concern  . Not on file   Social History Narrative   HSG, [employer], just shy of a MA in counseling. Work - Printmaker, Press photographer, corporate. SO - retired Company secretary, dealing with oral cancer. Live together and are independent in ADLs. ACP - no heroics, No DNI, Yes - DNR.           Review of Systems System review is negative for any constitutional, cardiac, pulmonary, GI or  neuro symptoms or complaints other than as described in the HPI.     Objective:   Physical Exam Filed Vitals:   01/08/14 0811  BP: 110/70  Pulse: 73  Temp: 98.7 F (37.1 C)    General: Well developed, well nourished, NAD, appears stated age HEENT: NCAT, PERRLA, EOMI, Anicteic Sclera, mucous membranes moist.  Neck: Supple, no JVD, no masses  Cardiovascular: S1 S2 auscultated, no rubs, murmurs or gallops. Regular rate and rhythm.  Respiratory: Clear to auscultation bilaterally with equal chest rise  Extremities: warm, dry without cyanosis, clubbing, or edema  Neuro: Alert and Oriented x3, cranial nerves II-XII grossly intact.  Skin: Without rashes, exudates, or nodules  Psych: Normal mood and affect with intact judgement and insight MSK: Right knee is swollen. Subpatellar contusion, tenderness and soreness at certain positions. No soreness with knee flexion/extension. Crepitus noted in R>L knee. Tender to palpation subpatellar along quadriceps tendon.   Current outpatient prescriptions:FLUoxetine (PROZAC) 40 MG capsule, Take 40 mg by mouth daily., Disp: , Rfl: ;  levothyroxine (SYNTHROID, LEVOTHROID) 50 MCG tablet, Take 1 tablet (50 mcg total) by mouth daily., Disp: 90 tablet, Rfl: 3;  metoprolol succinate (TOPROL-XL) 25 MG 24 hr tablet,  Take 1 tablet (25 mg total) by mouth daily., Disp: 90 tablet, Rfl: 3 triamterene-hydrochlorothiazide (MAXZIDE) 75-50 MG per tablet, Take 1 tablet by mouth daily., Disp: 90 tablet, Rfl: 3;  traZODone (DESYREL) 50 MG tablet, Take 1 tablet (50 mg total) by mouth at bedtime as needed for sleep., Disp: 30 tablet, Rfl: 3;  zolpidem (AMBIEN) 5 MG tablet, Take 1 tablet (5 mg total) by mouth at bedtime as needed for sleep., Disp: 15 tablet, Rfl: 1  Assessment:     Right Knee Contusion     Plan:     Right Knee Contusion - knee is stable, some crepitus noted - rest, ice, compression, elevation.

## 2014-01-08 NOTE — Patient Instructions (Addendum)
It has been a pleasure providing medical care for you and your family for all these years.  1) Right knee contusion - Noticed some crepitus (grinding) in your right knee.  - Your knee is stable, just sore where you fell. - RICE. Rest, Ice, Compression, and Elevation. - Knee will get better on its own, will just take some time.

## 2014-01-08 NOTE — Assessment & Plan Note (Signed)
BP Readings from Last 3 Encounters:  01/08/14 110/70  09/11/12 132/88  12/23/11 122/82    Patient is well controlled on current regimen.

## 2014-01-08 NOTE — Progress Notes (Signed)
Pre visit review using our clinic review tool, if applicable. No additional management support is needed unless otherwise documented below in the visit note. 

## 2014-01-08 NOTE — Assessment & Plan Note (Addendum)
Patient fell on R knee, which is leg affected by polio. Crepitus noted, but knee overall stable. No sensory loss to fine or deep touch. Well preserved ROM, unencumbered gait.   Plan No further diagnostic testing   For persistent pain or difficulty with gait will refer to sports medicine.

## 2014-01-08 NOTE — Assessment & Plan Note (Signed)
Patient well controlled on current regimen.

## 2014-01-08 NOTE — Assessment & Plan Note (Signed)
Lymphoma is in remission.

## 2014-01-18 ENCOUNTER — Telehealth: Payer: Self-pay | Admitting: *Deleted

## 2014-01-18 NOTE — Telephone Encounter (Signed)
Patient phoned stating that 3/4 scripts were sent to [facility] and is requesting that the one not sent be sent (Prozac).  CB# [phone]

## 2014-01-18 NOTE — Telephone Encounter (Signed)
Dr Linda Hedges,   Faythe Ghee to refill Prozac?

## 2014-01-18 NOTE — Telephone Encounter (Signed)
Didn't mean to print - ok to send in Rx.

## 2014-01-21 MED ORDER — FLUOXETINE HCL 40 MG PO CAPS: 40.0000 mg | ORAL_CAPSULE | Freq: Every day | ORAL | Status: DC

## 2014-01-21 NOTE — Telephone Encounter (Signed)
Sent scripts to [facility] and notified patient.

## 2014-01-25 ENCOUNTER — Other Ambulatory Visit: Payer: Self-pay | Admitting: Internal Medicine

## 2014-04-24 ENCOUNTER — Encounter: Payer: Self-pay | Admitting: Physician Assistant

## 2014-04-24 ENCOUNTER — Telehealth: Payer: Self-pay | Admitting: Physician Assistant

## 2014-04-24 ENCOUNTER — Ambulatory Visit (INDEPENDENT_AMBULATORY_CARE_PROVIDER_SITE_OTHER): Payer: Commercial Managed Care - HMO | Admitting: Physician Assistant

## 2014-04-24 VITALS — BP 124/84 | HR 89 | Temp 99.0°F | Resp 16 | Ht 67.0 in | Wt 200.0 lb

## 2014-04-24 DIAGNOSIS — F3289 Other specified depressive episodes: Secondary | ICD-10-CM

## 2014-04-24 DIAGNOSIS — F329 Major depressive disorder, single episode, unspecified: Secondary | ICD-10-CM

## 2014-04-24 DIAGNOSIS — G4700 Insomnia, unspecified: Secondary | ICD-10-CM

## 2014-04-24 DIAGNOSIS — I10 Essential (primary) hypertension: Secondary | ICD-10-CM

## 2014-04-24 DIAGNOSIS — C8319 Mantle cell lymphoma, extranodal and solid organ sites: Secondary | ICD-10-CM

## 2014-04-24 DIAGNOSIS — C8310 Mantle cell lymphoma, unspecified site: Secondary | ICD-10-CM

## 2014-04-24 DIAGNOSIS — C8590 Non-Hodgkin lymphoma, unspecified, unspecified site: Secondary | ICD-10-CM

## 2014-04-24 DIAGNOSIS — F411 Generalized anxiety disorder: Secondary | ICD-10-CM

## 2014-04-24 DIAGNOSIS — C8589 Other specified types of non-Hodgkin lymphoma, extranodal and solid organ sites: Secondary | ICD-10-CM

## 2014-04-24 DIAGNOSIS — F419 Anxiety disorder, unspecified: Secondary | ICD-10-CM

## 2014-04-24 DIAGNOSIS — F32A Depression, unspecified: Secondary | ICD-10-CM

## 2014-04-24 DIAGNOSIS — E039 Hypothyroidism, unspecified: Secondary | ICD-10-CM

## 2014-04-24 MED ORDER — ALPRAZOLAM 0.25 MG PO TABS: 0.2500 mg | ORAL_TABLET | Freq: Every day | ORAL | Status: DC

## 2014-04-24 MED ORDER — TRIAMTERENE-HCTZ 75-50 MG PO TABS: 1.0000 | ORAL_TABLET | Freq: Every day | ORAL | Status: DC

## 2014-04-24 MED ORDER — FLUOXETINE HCL 20 MG PO TABS: 20.0000 mg | ORAL_TABLET | Freq: Every day | ORAL | Status: DC

## 2014-04-24 MED ORDER — LEVOTHYROXINE SODIUM 50 MCG PO TABS: 50.0000 ug | ORAL_TABLET | Freq: Every day | ORAL | Status: DC

## 2014-04-24 MED ORDER — METOPROLOL SUCCINATE ER 25 MG PO TB24: 25.0000 mg | ORAL_TABLET | Freq: Every day | ORAL | Status: DC

## 2014-04-24 MED ORDER — FLUOXETINE HCL 40 MG PO CAPS: 40.0000 mg | ORAL_CAPSULE | Freq: Every day | ORAL | Status: DC

## 2014-04-24 NOTE — Progress Notes (Signed)
Pre visit review using our clinic review tool, if applicable. No additional management support is needed unless otherwise documented below in the visit note/SLS  

## 2014-04-24 NOTE — Progress Notes (Signed)
Patient presents to clinic today to establish care.  Acute Concerns: Depression --  Currently on 40 mg daily.  Endorses continued depressive symptoms.  Patient with history of Mantle Cell Lymphoma.  Husband with small cell and non-small cell lung cancer, with extensive disease. Therefore, there is a lot of stress in her life.  Also trying to downsize from their home to a smaller house. Patient denies suicidal thoughts or ideation.   Chronic Issues: Hypertension -- Patient currently on metoprolol and Maxzide.  Endorses taking as directed. BP 124/84 in clinic.  Denies headache, vision changes, chest pain, palpitations, SOB, lightheadedness or dizziness.  Hypothyroidism -- currently on 50 mcg.  Needs repeat TSH.  Post-polio Syndrome -- stable.  Legs are of differing lengths.   Hyperlipidemia -- Patient currently on no medications.  Needs repeat lipid panel.  Health Maintenance: Immunizations -- up-to-date Colonoscopy -- refuses colonoscopy has been unable to tolerate procedure twice.  Mammogram -- diagnostic mammogram and ultrasound 6 months ago; no abnormal findings.  Repeat in September.  Followed by Oncology due to hx of breast cancer.  PAP -- s/p hysterectomy  Bone Density -- 1 year ago; endorses no evidence of osteoporosis.   Past Medical History  Diagnosis Date  . S/P radiation therapy   . Post-polio syndrome   . Other dyspnea and respiratory abnormality   . Synovial cyst, unspecified   . Unspecified hypothyroidism   . Unspecified essential hypertension   . Other and unspecified hyperlipidemia   . Depressive disorder, not elsewhere classified   . History of breast cancer   . Appendicitis 11/2007    Past Surgical History  Procedure Laterality Date  . Polio related surgeries    . Breast biopsy    . Abdominal hysterectomy    . Breast lumpectomy    . Appendectomy  11/2007    No current outpatient prescriptions on file prior to visit.   No current facility-administered  medications on file prior to visit.    Allergies  Allergen Reactions  . Amoxicillin-Pot Clavulanate   . Cephalosporins   . Codeine   . Enalapril Maleate   . Epinephrine   . Macrolides And Ketolides   . Paclitaxel   . Penicillins   . Simvastatin     REACTION: all statins  . Sulfonamide Derivatives   . Tetracyclines & Related     Family History  Problem Relation Age of Onset  . COPD Mother   . Cancer Neg Hx   . Diabetes Neg Hx   . Hypertension Neg Hx   . COPD Brother   . Heart disease Brother     History   Social History  . Marital Status: Married    Spouse Name: N/A    Number of Children: 1  . Years of Education: 49   Occupational History  . Homemaker    Social History Main Topics  . Smoking status: Never Smoker   . Smokeless tobacco: Never Used  . Alcohol Use: Yes     Comment: occasional  . Drug Use: No  . Sexual Activity: No   Other Topics Concern  . Not on file   Social History Narrative   HSG, [employer], just shy of a MA in counseling. Work - Printmaker, Press photographer, corporate. SO - retired Company secretary, dealing with oral cancer. Live together and are independent in ADLs. ACP - no heroics, No DNI, Yes - DNR.         ROS See HPI.  All other ROS are negative.  BP 124/84  Pulse 89  Temp(Src) 99 F (37.2 C) (Oral)  Resp 16  Ht 5\' 7"  (1.702 m)  Wt 200 lb (90.719 kg)  BMI 31.32 kg/m2  SpO2 98%  Physical Exam  Vitals reviewed. Constitutional: She is oriented to person, place, and time and well-developed, well-nourished, and in no distress.  HENT:  Head: Normocephalic and atraumatic.  Right Ear: External ear normal.  Left Ear: External ear normal.  Nose: Nose normal.  Mouth/Throat: Oropharynx is clear and moist. No oropharyngeal exudate.  TM within normal limits bilaterally.  Eyes: Conjunctivae are normal. Pupils are equal, round, and reactive to light.  Neck: Neck supple. No thyromegaly present.  Cardiovascular: Normal rate, regular rhythm,  normal heart sounds and intact distal pulses.   Pulmonary/Chest: Effort normal and breath sounds normal. No respiratory distress. She has no wheezes. She has no rales. She exhibits no tenderness.  Lymphadenopathy:    She has no cervical adenopathy.  Neurological: She is alert and oriented to person, place, and time.  Skin: Skin is warm and dry. No rash noted.  Psychiatric: Affect normal.   Assessment/Plan: HYPERTENSION Well controlled.  Continue current regimen.  HYPOTHYROIDISM Patient to return for labs -- including TSH and T4.   Mantle cell lymphoma Follow-up with Oncology as scheduled.  DEPRESSION Increase Prozac to 60 mg daily.  Continue benzo as needed.  Follow-up in 1 month.

## 2014-04-24 NOTE — Patient Instructions (Signed)
Please continue medications as directed.  For the Prozac we are increasing to 60 mg daily (You will have two pills -- the 40 mg capsule and the 20 mg tablet.  Please take as directed.  Follow-up in 1 month.  Please come fasting to follow-up so that we can get blood work.  It was a pleasure participating in your care today.

## 2014-04-24 NOTE — Telephone Encounter (Signed)
Relevant patient education assigned to patient using Emmi. ° °

## 2014-04-29 NOTE — Assessment & Plan Note (Signed)
Increase Prozac to 60 mg daily.  Continue benzo as needed.  Follow-up in 1 month.

## 2014-04-29 NOTE — Assessment & Plan Note (Signed)
Patient to return for labs -- including TSH and T4.

## 2014-04-29 NOTE — Assessment & Plan Note (Signed)
Well-controlled.  Continue current regimen. 

## 2014-04-29 NOTE — Assessment & Plan Note (Signed)
- 

## 2014-05-03 ENCOUNTER — Telehealth: Payer: Self-pay | Admitting: Physician Assistant

## 2014-05-03 DIAGNOSIS — M109 Gout, unspecified: Secondary | ICD-10-CM

## 2014-05-03 MED ORDER — COLCHICINE 0.6 MG PO TABS: ORAL_TABLET | ORAL | Status: DC

## 2014-05-03 NOTE — Telephone Encounter (Signed)
I will send in refill of Colcrys -- take 2 tablets.  Then take 3rd tablet one day later. Can repeat course Monday if symptoms have not completely resolved.  Allopurinol is not to be used with an acute gout flare -- this is a medicine that is taking to prevent gout.  Apply ice pack to affected joint.  Keep joint elevated.  Avoid alcohol, red meats and tomato-based products.  If no symptom improvement she will need to return to clinic.

## 2014-05-03 NOTE — Telephone Encounter (Signed)
Patient called back regarding this. Informed her of what Einar Pheasant says.

## 2014-05-03 NOTE — Telephone Encounter (Signed)
Patient states that she is having a gout flare-up and would like a refill of colcrys and allopurinol sent to [facility]

## 2014-06-05 ENCOUNTER — Telehealth: Payer: Self-pay | Admitting: Physician Assistant

## 2014-06-05 NOTE — Telephone Encounter (Signed)
Refill- colchicine  [place]

## 2014-06-06 NOTE — Telephone Encounter (Signed)
LMOM with contact name and number for return call RE: Rx request per provider instructions, need clarification as to if pt is having a Gout flare-up and patient needs OV for A&E prior to beginning Allopurinol [which cannot be taken during a Gout flare per provider]/SLS

## 2014-06-10 NOTE — Telephone Encounter (Signed)
LMOM [detailed] with contact name and number for return call RE: needed information concerning if pt is having a Gout flare-up and that she will be needing an OV before beginning maintenance medication per provider instructions/SLS

## 2014-06-14 NOTE — Telephone Encounter (Signed)
LMOM [3rd-detailed] with contact name and number for return call RE: information needed RE: Gout & OV needed per provider instructions/SLS Letter Mailed.

## 2014-06-17 ENCOUNTER — Telehealth: Payer: Self-pay | Admitting: Physician Assistant

## 2014-06-17 NOTE — Telephone Encounter (Signed)
Call Documentation     Rockwell Germany, CMA at 06/14/2014 8:57 AM     Status: Signed        LMOM [3rd-detailed] with contact name and number for return call RE: information needed RE: Gout & OV needed per provider instructions/SLS  Letter Mailed.        Rockwell Germany, CMA at 06/10/2014 11:29 AM     Status: Signed        LMOM [detailed] with contact name and number for return call RE: needed information concerning if pt is having a Gout flare-up and that she will be needing an OV before beginning maintenance medication per provider instructions/SLS         Rockwell Germany, CMA at 06/06/2014 10:01 AM     Status: Signed        LMOM with contact name and number for return call RE: Rx request per provider instructions, need clarification as to if pt is having a Gout flare-up and patient needs OV for A&E prior to beginning Allopurinol [which cannot be taken during a Gout flare per provider]/SLS         Irven Baltimore at 06/05/2014 11:22 AM     Status: Signed        Refill- colchicine  [place]

## 2014-06-17 NOTE — Telephone Encounter (Signed)
Requesting refill on Colchicine 0.6 mg, Take 2 tabs by mouth,then take 3rd tab 1 hr later Please call into [facility] [phone]

## 2014-06-25 ENCOUNTER — Telehealth: Payer: Self-pay | Admitting: Physician Assistant

## 2014-06-25 NOTE — Telephone Encounter (Signed)
Spoke with pt. And she states that she received our letter. Pt states that she does not wish to proceed with allopurinol as a maintenance medication as she has been on it in the past and doesn't want to go back on it. States her current gout flare up has resolved and she no longer needs the Colcrys. States she has been under a lot of stress recently with multiple factors at home and tends to get flare ups during those times. States her flare ups do resolve on their own but colchicine works faster and she is able to walk better when on the medication. Also states that she has made dietary changes for a while and is doing what she can to prevent future flares.

## 2014-06-25 NOTE — Telephone Encounter (Signed)
COLCHICINE 0.6MG  TABLET QTY 3

## 2014-06-25 NOTE — Telephone Encounter (Signed)
I am confused.  Is she requesting an Rx to have at homein case of a new flare or are you saying she does not want the medication anymore?

## 2014-06-26 NOTE — Telephone Encounter (Signed)
Thank you for clarifying.

## 2014-06-26 NOTE — Telephone Encounter (Signed)
Originally she was requesting Colcrys for a "current flare up". Pt told me yesterday that her symptoms had resolved on their own and she did not want it called in at present. She stated that Dr Linda Hedges would refill Rx anytime she called with a flare up. Pt was previously told by our office that she may need to start allopurinol as maintenance and pt declines that option as well.

## 2014-07-04 ENCOUNTER — Telehealth: Payer: Self-pay | Admitting: Physician Assistant

## 2014-07-04 ENCOUNTER — Telehealth: Payer: Self-pay

## 2014-07-04 DIAGNOSIS — F329 Major depressive disorder, single episode, unspecified: Secondary | ICD-10-CM

## 2014-07-04 DIAGNOSIS — F32A Depression, unspecified: Secondary | ICD-10-CM

## 2014-07-04 MED ORDER — FLUOXETINE HCL 40 MG PO CAPS: 40.0000 mg | ORAL_CAPSULE | Freq: Every day | ORAL | Status: DC

## 2014-07-04 MED ORDER — FLUOXETINE HCL 60 MG PO TABS: 60.0000 mg | ORAL_TABLET | Freq: Every day | ORAL | Status: DC

## 2014-07-04 NOTE — Telephone Encounter (Signed)
Medication refilled -- 90-day supply

## 2014-07-04 NOTE — Telephone Encounter (Signed)
Pt reports the 20 mg was too costly of the Fluoxetine and has been taking only the 40 mg; per her Insurance, the 60 mg tab would be of no cost with a 90-day supply to mail order pharmacy. Change made per VO by provider and Rx sent to [facility]/SLS

## 2014-07-04 NOTE — Telephone Encounter (Signed)
Refill-fluoxetine  [facility]

## 2014-07-04 NOTE — Telephone Encounter (Signed)
rx request from [facility] sent to [facility]. Message routed to new PCP

## 2014-07-25 ENCOUNTER — Ambulatory Visit (HOSPITAL_COMMUNITY): Payer: Medicare HMO

## 2014-07-25 ENCOUNTER — Encounter: Payer: Self-pay | Admitting: Physician Assistant

## 2014-07-25 ENCOUNTER — Ambulatory Visit (INDEPENDENT_AMBULATORY_CARE_PROVIDER_SITE_OTHER): Payer: Commercial Managed Care - HMO | Admitting: Physician Assistant

## 2014-07-25 ENCOUNTER — Ambulatory Visit (HOSPITAL_BASED_OUTPATIENT_CLINIC_OR_DEPARTMENT_OTHER)
Admission: RE | Admit: 2014-07-25 | Discharge: 2014-07-25 | Disposition: A | Discharge status: Home / Self Care | Payer: Medicare HMO | Source: Ambulatory Visit | Attending: Physician Assistant | Admitting: Physician Assistant

## 2014-07-25 ENCOUNTER — Telehealth: Payer: Self-pay | Admitting: Physician Assistant

## 2014-07-25 VITALS — BP 112/82 | HR 79 | Temp 98.0°F | Resp 16 | Ht 67.0 in

## 2014-07-25 DIAGNOSIS — R059 Cough, unspecified: Secondary | ICD-10-CM | POA: Diagnosis not present

## 2014-07-25 DIAGNOSIS — R05 Cough: Secondary | ICD-10-CM | POA: Insufficient documentation

## 2014-07-25 DIAGNOSIS — R062 Wheezing: Secondary | ICD-10-CM

## 2014-07-25 DIAGNOSIS — R053 Chronic cough: Secondary | ICD-10-CM

## 2014-07-25 MED ORDER — BENZONATATE 100 MG PO CAPS: 100.0000 mg | ORAL_CAPSULE | Freq: Two times a day (BID) | ORAL | Status: DC | PRN

## 2014-07-25 MED ORDER — FLUOXETINE HCL 60 MG PO TABS: 60.0000 mg | ORAL_TABLET | Freq: Every day | ORAL | Status: DC

## 2014-07-25 NOTE — Progress Notes (Signed)
Pre visit review using our clinic review tool, if applicable. No additional management support is needed unless otherwise documented below in the visit note/SLS  

## 2014-07-25 NOTE — Progress Notes (Signed)
Patient presents to clinic today c/o chronic cough.  Patient endorses cough is worse at night and after meals.  Endorses heartburn and noted reflux.  Denies chest congestion, pleuritic chest pain or wheeze. Patient is not on ACEI.  Past Medical History  Diagnosis Date  . S/P radiation therapy   . Post-polio syndrome   . Other dyspnea and respiratory abnormality   . Synovial cyst, unspecified   . Unspecified hypothyroidism   . Unspecified essential hypertension   . Other and unspecified hyperlipidemia   . Depressive disorder, not elsewhere classified   . History of breast cancer   . Appendicitis 11/2007    Current Outpatient Prescriptions on File Prior to Visit  Medication Sig Dispense Refill  . levothyroxine (SYNTHROID, LEVOTHROID) 50 MCG tablet Take 1 tablet (50 mcg total) by mouth daily.  90 tablet  1  . metoprolol succinate (TOPROL-XL) 25 MG 24 hr tablet Take 1 tablet (25 mg total) by mouth daily.  90 tablet  3  . Naproxen Sod-Diphenhydramine (ALEVE PM) 220-25 MG TABS Take 1 tablet by mouth at bedtime as needed.      . triamterene-hydrochlorothiazide (MAXZIDE) 75-50 MG per tablet Take 1 tablet by mouth daily.  90 tablet  3   No current facility-administered medications on file prior to visit.    Allergies  Allergen Reactions  . Amoxicillin-Pot Clavulanate   . Cephalosporins   . Codeine   . Enalapril Maleate   . Epinephrine   . Macrolides And Ketolides   . Paclitaxel   . Penicillins   . Simvastatin     REACTION: all statins  . Sulfonamide Derivatives   . Tetracyclines & Related     Family History  Problem Relation Age of Onset  . COPD Mother   . Cancer Neg Hx   . Diabetes Neg Hx   . Hypertension Neg Hx   . COPD Brother   . Heart disease Brother     History   Social History  . Marital Status: Married    Spouse Name: N/A    Number of Children: 1  . Years of Education: 56   Occupational History  . Homemaker    Social History Main Topics  . Smoking status:  Never Smoker   . Smokeless tobacco: Never Used  . Alcohol Use: Yes     Comment: occasional  . Drug Use: No  . Sexual Activity: No   Other Topics Concern  . None   Social History Narrative   HSG, [employer], just shy of a MA in counseling. Work - Printmaker, Press photographer, corporate. SO - retired Company secretary, dealing with oral cancer. Live together and are independent in ADLs. ACP - no heroics, No DNI, Yes - DNR.         Review of Systems - See HPI.  All other ROS are negative.  BP 112/82  Pulse 79  Temp(Src) 98 F (36.7 C) (Oral)  Resp 16  Ht 5\' 7"  (1.702 m)  SpO2 97%  Physical Exam  Vitals reviewed. Constitutional: She is oriented to person, place, and time and well-developed, well-nourished, and in no distress.  HENT:  Head: Normocephalic and atraumatic.  Right Ear: External ear normal.  Left Ear: External ear normal.  Nose: Nose normal.  Mouth/Throat: Oropharynx is clear and moist. No oropharyngeal exudate.  TM within normal limits bilaterally.  Eyes: Conjunctivae are normal.  Neck: Neck supple. No thyromegaly present.  Cardiovascular: Normal rate, regular rhythm, normal heart sounds and intact distal pulses.   Pulmonary/Chest:  Effort normal and breath sounds normal. No respiratory distress. She has no wheezes. She has no rales. She exhibits no tenderness.  Lymphadenopathy:    She has no cervical adenopathy.  Neurological: She is alert and oriented to person, place, and time.  Skin: Skin is warm and dry. No rash noted.  Psychiatric: Affect normal.   Assessment/Plan: Chronic cough Possibly GERD related. Due to history, will obtain CXR.  Will attempt trial of Zantac 150 mg BID.  Avoid late night eating.  Avoid trigger foods.  Elevate HOB.  Rx Tessalon Perles for cough.  Follow-up in 2 weeks.

## 2014-07-25 NOTE — Telephone Encounter (Signed)
Discussed normal CXR results with patient.  Continue care as discussed at today's visit.

## 2014-07-25 NOTE — Patient Instructions (Addendum)
Take Zantac 150 mg twice daily for 2 weeks.  Avoid late-night eating.  Elevate the head of your bed.  Use Tessalon Perles for cough.  Increase fluid intake.    You should have a new prescription for the Fluoxetine -- 60 mg tablet on its way from [facility].  Please take as directed.    Follow-up in 2 weeks

## 2014-07-29 DIAGNOSIS — R053 Chronic cough: Secondary | ICD-10-CM | POA: Insufficient documentation

## 2014-07-29 DIAGNOSIS — R05 Cough: Secondary | ICD-10-CM | POA: Insufficient documentation

## 2014-07-29 NOTE — Assessment & Plan Note (Addendum)
Possibly GERD related. Due to history, will obtain CXR.  Will attempt trial of Zantac 150 mg BID.  Avoid late night eating.  Avoid trigger foods.  Elevate HOB.  Rx Tessalon Perles for cough.  Follow-up in 2 weeks.

## 2014-08-19 ENCOUNTER — Telehealth: Payer: Self-pay | Admitting: Physician Assistant

## 2014-08-19 NOTE — Telephone Encounter (Signed)
Spoke with the pt and informed her of Melissa's note and recommendation.  Pt stated that she had already be evaluated by Einar Pheasant and she don't feel she needs to come back for a OV for another evaluation.  She stated that she went to see the Oncologist to make should the CA had not come back, and he did an exam and ordered an CT scan of the chest and bronchial area.  Pt stated that she received a call from them today and everything was okay.  Asked the pt if she feels she needs to be re-evaluated, and she stated no she has already been evaluated.  Pt said if the cough does not get any better she will give [place] a call back.  Informed the pt that I will informed Einar Pheasant of this.  Pt agreed.//AB/CMA

## 2014-08-19 NOTE — Telephone Encounter (Signed)
Thank you for making me aware.  I agree with NP O'Sullivan;s assessment and recommendations.

## 2014-08-19 NOTE — Telephone Encounter (Signed)
I do not see any meds on her list that should cause cough.  I would recommend that the patient schedule office visit for evaluation.

## 2014-08-19 NOTE — Telephone Encounter (Signed)
Caller name: Micaila Relation to pt: self Call back number: [phone] Pharmacy:  Reason for call:   Patient states that she is still coughing and thinks this is a result from one of her medications. She would like you to look over her medications. Patient refused to come in to be seen.

## 2014-09-05 ENCOUNTER — Telehealth: Payer: Self-pay | Admitting: Physician Assistant

## 2014-09-05 ENCOUNTER — Other Ambulatory Visit: Payer: Self-pay | Admitting: Family Medicine

## 2014-09-05 DIAGNOSIS — C50919 Malignant neoplasm of unspecified site of unspecified female breast: Secondary | ICD-10-CM

## 2014-09-05 DIAGNOSIS — R05 Cough: Secondary | ICD-10-CM

## 2014-09-05 DIAGNOSIS — R059 Cough, unspecified: Secondary | ICD-10-CM

## 2014-09-05 NOTE — Telephone Encounter (Signed)
done

## 2014-09-05 NOTE — Telephone Encounter (Signed)
Caller name: Alleta  Relation to pt: self  Call back number: [phone]  Reason for call:   pt requesting a referral Oncology: Carola Frost MD and would like to inform you cough is persistent and she will not be able to come in for an OV.

## 2014-11-13 ENCOUNTER — Other Ambulatory Visit: Payer: Self-pay | Admitting: Physician Assistant

## 2014-11-13 MED ORDER — MAGIC MOUTHWASH: 5.0000 mL | Freq: Three times a day (TID) | ORAL | Status: DC

## 2014-11-13 NOTE — Telephone Encounter (Signed)
Rx printed, signed and faxed to [facility]

## 2014-11-13 NOTE — Telephone Encounter (Signed)
Caller name:Ashley-[facility] Relation to QN:VVYX Call back [phone] Pharmacy:  Reason for call: [facility] is needing an order for a mouth wash, pt receiving chemo therapy and radiation and has developed sores in the mouth.  Call in to pharmacy on file.

## 2014-11-14 NOTE — Telephone Encounter (Signed)
Angie from [facility] states that they do not carry magic mouthwash. Please resend to local pharmacy.

## 2014-11-15 ENCOUNTER — Other Ambulatory Visit: Payer: Self-pay | Admitting: General Practice

## 2014-11-15 MED ORDER — MAGIC MOUTHWASH: 5.0000 mL | Freq: Three times a day (TID) | ORAL | Status: DC

## 2014-11-15 NOTE — Addendum Note (Signed)
Addended by: Kris Hartmann on: 11/15/2014 08:28 AM   Modules accepted: Orders

## 2014-11-15 NOTE — Telephone Encounter (Signed)
Medication resent to [facility] on med list.

## 2014-11-25 ENCOUNTER — Telehealth: Payer: Self-pay | Admitting: Physician Assistant

## 2014-11-25 NOTE — Telephone Encounter (Signed)
Caller name: Amani Relation to pt: self Call back number: [phone] Pharmacy:  [facility] in [place]  Reason for call:   Patient requesting a refill of her gout medication(colcrist sp?)

## 2014-11-26 MED ORDER — COLCHICINE 0.6 MG PO TABS: ORAL_TABLET | ORAL | Status: DC

## 2014-11-26 NOTE — Telephone Encounter (Signed)
Medication sent to pharmacy.  Take as directed for gout flare.  If not improving, needs OV.

## 2014-11-26 NOTE — Telephone Encounter (Signed)
Last OV 07-25-14 (chronic cough) Colchicine last filled 05-03-14 #3 with 0  Last Uric acid level drawn in 2013.

## 2014-11-26 NOTE — Telephone Encounter (Signed)
Pt notified and states an understanding.

## 2014-11-26 NOTE — Telephone Encounter (Signed)
Pt started crying on the phone states that she has stage 4 cancer and is bedridden right now. Will cal the office if symptoms worsen but is unsure if she is able to come into the office.

## 2014-11-26 NOTE — Telephone Encounter (Signed)
Hopefully the medication will help things.  We will try to work with her if things aren't improving.  However, there is only so much we can do without examining the patient and getting blood work.

## 2015-01-22 ENCOUNTER — Telehealth: Payer: Self-pay | Admitting: Physician Assistant

## 2015-01-22 NOTE — Telephone Encounter (Signed)
Caller name:Lisa-[facility] Relation to UR:KYHCW Call back number:[phone] Pharmacy:  Reason for call: pt's right wrist is warm to touch and swollen at joints, a lot of burning and shooting pain, [facility] would like to know what she they do for treatment.

## 2015-01-22 NOTE — Telephone Encounter (Signed)
Patient states that she is prescribed Colchicine to be taken on daily basis by Dr. Georgiann Cocker, her Oncologist, and that this is a Gout problem. Informed patient that per provider, she needs to come in to office for A&E; pt states that she will call back to schedule when she can arrange transportation [declined to schedule now]/SLS LMOM with contact name and number [for return call, if needed] RE: patient's response via telephone/SLS

## 2015-02-24 ENCOUNTER — Ambulatory Visit (INDEPENDENT_AMBULATORY_CARE_PROVIDER_SITE_OTHER): Payer: 59 | Admitting: Medical

## 2015-02-24 ENCOUNTER — Encounter: Payer: Self-pay | Admitting: Medical

## 2015-02-24 ENCOUNTER — Ambulatory Visit (HOSPITAL_BASED_OUTPATIENT_CLINIC_OR_DEPARTMENT_OTHER)
Admission: RE | Admit: 2015-02-24 | Discharge: 2015-02-24 | Disposition: A | Discharge status: Home / Self Care | Payer: 59 | Source: Ambulatory Visit | Attending: Medical | Admitting: Medical

## 2015-02-24 VITALS — BP 150/95 | HR 80 | Temp 98.4°F | Wt 165.0 lb

## 2015-02-24 DIAGNOSIS — E038 Other specified hypothyroidism: Secondary | ICD-10-CM | POA: Diagnosis not present

## 2015-02-24 DIAGNOSIS — R059 Cough, unspecified: Secondary | ICD-10-CM

## 2015-02-24 DIAGNOSIS — R05 Cough: Secondary | ICD-10-CM | POA: Insufficient documentation

## 2015-02-24 DIAGNOSIS — Z853 Personal history of malignant neoplasm of breast: Secondary | ICD-10-CM | POA: Diagnosis not present

## 2015-02-24 MED ORDER — COLCHICINE 0.6 MG PO TABS: ORAL_TABLET | ORAL | Status: DC

## 2015-02-24 MED ORDER — LEVOFLOXACIN 500 MG PO TABS: 500.0000 mg | ORAL_TABLET | Freq: Every day | ORAL | Status: DC

## 2015-02-24 MED ORDER — BENZONATATE 100 MG PO CAPS: 100.0000 mg | ORAL_CAPSULE | Freq: Three times a day (TID) | ORAL | Status: DC | PRN

## 2015-02-24 NOTE — Assessment & Plan Note (Addendum)
Will get cxr today stat. I wanted to get cbc today to assess your infection fighting cell count. You declined.  So when you see your specialist who is treating you skin infection please get cbc results and have them send it to our office.  I think levofloxin may be a antibiotic to consider starting on wed when you are finishing your clindamycin.  I would want to know result of cxr and result of cbc before advising on possible levofloxin.  I am providing you with my business card so specialist can call me on Wednesday.  I am prescribing benzonatate for cough.  I am providing rx of levofloxin. I would advise not fill this until I get cbc results and would like to consult  With specialist treating the skin infection.  Follow up in 7 days with pcp here or as needed with me.

## 2015-02-24 NOTE — Progress Notes (Signed)
Pre visit review using our clinic review tool, if applicable. No additional management support is needed unless otherwise documented below in the visit note. 

## 2015-02-24 NOTE — Patient Instructions (Addendum)
Cough Will get cxr today stat. I wanted to get cbc today to assess your infection fighting cell count. You declined.  So when you see your specialist who is treating you skin infection please get cbc results and have them send it to our office.  I think levofloxin may be a antibiotic to consider starting on wed when you are finishing your clindamycin.  I would want to know result of cxr and result of cbc before advising on possible levofloxin.  I am providing you with my business card so specialist can call me on Wednesday.  I am prescribing benzonatate for cough.  I am providing rx of levofloxin. I would advise not fill this until I get cbc results and would like to consult  With specialist treating the skin infection.  Follow up in 7 days with pcp here or as needed with me.    Note oncologist last rx'd her thryoid med. Pt last tsh done here. I wanted to get tsh level today. Pt declined. Deferred refill of thryoid med to last prescriber.

## 2015-02-24 NOTE — Progress Notes (Signed)
Subjective:    Patient ID: [name], female    DOB: [birth year], 69 y.o.   MRN: [id number]  HPI  Pt in with some cough. Pt cough has been for about one week. Pt had some cellulitis post bilateral mastectomy. Pt was placed on clindamycin by surgeon 2 wks ago. Pt has about 3 mores days of clindamycin. Pt is bringing up mucous with the cough over past week. Some fatigue post surgery. No fevers or chills reported.  Pt will see specialist who put her on clindamycin on 30th.(2 days from now) Pt early on had fevers and chills before antibiotic. Still has some chills. Pt is on chemo presently.   Pt does decline cbc today  recommended by myself.  Pt wants refill of colchicine if gets acute gouty attack(in her feet usual per pt). None presently    Pt does states pain from surgery is now resolved.     Review of Systems  Constitutional: Positive for chills and fatigue. Negative for fever and diaphoresis.  HENT: Negative.   Respiratory: Positive for cough. Negative for apnea, chest tightness, shortness of breath and wheezing.   Cardiovascular: Negative for chest pain and palpitations.  Gastrointestinal: Negative for abdominal pain.  Musculoskeletal: Negative for back pain.  Skin:       Skin rt upper chest and shoulder not warm or red.  Neurological: Negative for headaches.  Hematological: Negative for adenopathy. Does not bruise/bleed easily.  Psychiatric/Behavioral: Negative for behavioral problems and confusion.   Past Medical History  Diagnosis Date  . S/P radiation therapy   . Post-polio syndrome   . Other dyspnea and respiratory abnormality   . Synovial cyst, unspecified   . Unspecified hypothyroidism   . Unspecified essential hypertension   . Other and unspecified hyperlipidemia   . Depressive disorder, not elsewhere classified   . History of breast cancer   . Appendicitis 11/2007    History   Social History  . Marital Status: Married    Spouse Name: N/A  . Number  of Children: 1  . Years of Education: 6   Occupational History  . Homemaker    Social History Main Topics  . Smoking status: Never Smoker   . Smokeless tobacco: Never Used  . Alcohol Use: Yes     Comment: occasional  . Drug Use: No  . Sexual Activity: No   Other Topics Concern  . Not on file   Social History Narrative   HSG, [employer], just shy of a MA in counseling. Work - Printmaker, Press photographer, corporate. SO - retired Company secretary, dealing with oral cancer. Live together and are independent in ADLs. ACP - no heroics, No DNI, Yes - DNR.          Past Surgical History  Procedure Laterality Date  . Polio related surgeries    . Breast biopsy    . Abdominal hysterectomy    . Breast lumpectomy    . Appendectomy  11/2007    Family History  Problem Relation Age of Onset  . COPD Mother   . Cancer Neg Hx   . Diabetes Neg Hx   . Hypertension Neg Hx   . COPD Brother   . Heart disease Brother     Allergies  Allergen Reactions  . Amoxicillin-Pot Clavulanate   . Cephalosporins   . Codeine   . Enalapril Maleate   . Epinephrine   . Macrolides And Ketolides   . Paclitaxel   . Penicillins   . Simvastatin  REACTION: all statins  . Sulfonamide Derivatives   . Tetracyclines & Related     Current Outpatient Prescriptions on File Prior to Visit  Medication Sig Dispense Refill  . FLUoxetine HCl 60 MG TABS Take 60 mg by mouth daily. 90 tablet 1  . metoprolol succinate (TOPROL-XL) 25 MG 24 hr tablet Take 1 tablet (25 mg total) by mouth daily. 90 tablet 3  . Naproxen Sod-Diphenhydramine (ALEVE PM) 220-25 MG TABS Take 1 tablet by mouth at bedtime as needed.    . triamterene-hydrochlorothiazide (MAXZIDE) 75-50 MG per tablet Take 1 tablet by mouth daily. (Patient not taking: Reported on 02/24/2015) 90 tablet 3   No current facility-administered medications on file prior to visit.    BP 150/95 mmHg  Pulse 80  Temp(Src) 98.4 F (36.9 C) (Oral)  Wt 165 lb (74.844 kg)  SpO2  98%      Objective:   Physical Exam  General  Mental Status - Alert. General Appearance - Well groomed. Not in acute distress.  Skin Rashes- No Rashes.  HEENT Head- Normal. Ear Auditory Canal - Left- Normal. Right - Normal.Tympanic Membrane- Left- Normal. Right- Normal. Eye Sclera/Conjunctiva- Left- Normal. Right- Normal. Nose & Sinuses Nasal Mucosa- Left-  Not boggy or Congested. Right-  Not  boggy or Congested. Mouth & Throat Lips: Upper Lip- Normal: no dryness, cracking, pallor, cyanosis, or vesicular eruption. Lower Lip-Normal: no dryness, cracking, pallor, cyanosis or vesicular eruption. Buccal Mucosa- Bilateral- No Aphthous ulcers. Oropharynx- No Discharge or Erythema. Tonsils: Characteristics- Bilateral- No Erythema or Congestion. Size/Enlargement- Bilateral- No enlargement. Discharge- bilateral-None.  Neck Neck- Supple. No Masses.   Chest and Lung Exam Auscultation: Breath Sounds:- even and unlabored.  Cardiovascular Auscultation:Rythm- Regular, rate and rhythm. Murmurs & Other Heart Sounds:Ausculatation of the heart reveal- No Murmurs.  Lymphatic Head & Neck General Head & Neck Lymphatics: Bilateral: Description- No Localized lymphadenopathy.  Skin- around her dressing upper chest particular rt side and toward her rt shoulder. No redness or warmth.      Assessment & Plan:

## 2015-02-28 ENCOUNTER — Telehealth: Payer: Self-pay | Admitting: Medical

## 2015-02-28 NOTE — Telephone Encounter (Signed)
I got cbc from specialist office today. This was test I wanted to do on day she was in the office but she declined. Did not get consult call from specialist. I wanted to talk with him since she was already on an antibiotic. So I called pt today and no answer. Left message on her phone asking for her to call me on Monday morning with update. Also I think pt pcp Dr. Charlett Blake will be in th office.

## 2015-03-20 ENCOUNTER — Telehealth: Payer: Self-pay | Admitting: Physician Assistant

## 2015-03-20 DIAGNOSIS — I10 Essential (primary) hypertension: Secondary | ICD-10-CM

## 2015-03-20 NOTE — Telephone Encounter (Signed)
Caller name: Lysha Relation to pt: self Call back number: [phone] Pharmacy:  Reason for call:   Patient has questions regarding bp medication. She wants to know if she should be taking one medication or a combination of two medications?

## 2015-03-20 NOTE — Telephone Encounter (Signed)
Closed in error.

## 2015-03-20 NOTE — Telephone Encounter (Signed)
Left message for patient to return my call.

## 2015-03-20 NOTE — Telephone Encounter (Signed)
At last visit with me in 06/2014 she was on two medicines -- Maxxide and Metoprolol. Unless her specialist has changed her regimen, she should be on two medications.  If she has any further questions, she should come see me in office as she is well overdue for a follow-up visit.

## 2015-03-21 NOTE — Telephone Encounter (Signed)
LMOM with contact name and number for return call RE: local pharmacy she wishes BP medications to be sent to, request that she take both medications prescribed as her BP was very elevated at her 03.28.16 OV w/E Saguier, PA-C; gave our condolences as we have been informed that her spouse has passed away and asked pt that when she is through with this 'trying time', to please call office and schedule a F/U office visit to discuss BP. Informed that she can leave message with staff RE: pharmacy and they will get note to me/SLS

## 2015-03-21 NOTE — Telephone Encounter (Signed)
Left message for patient to return my call.

## 2015-03-21 NOTE — Telephone Encounter (Signed)
Informed patient of this. She states that she has only been taking one medication. Offered patient appointment, she declined stating that she has had too much going on(her husband passed). Patient started crying. She would like refill of metroprol sent to [facility] on [address] in [place]

## 2015-03-24 MED ORDER — TRIAMTERENE-HCTZ 75-50 MG PO TABS: 1.0000 | ORAL_TABLET | Freq: Every day | ORAL | Status: DC

## 2015-03-24 MED ORDER — METOPROLOL SUCCINATE ER 25 MG PO TB24: 25.0000 mg | ORAL_TABLET | Freq: Every day | ORAL | Status: DC

## 2015-03-24 NOTE — Telephone Encounter (Signed)
Pharmacy:[facility]  [phone], [place], [place] - [address] near [place] [place]

## 2015-03-24 NOTE — Addendum Note (Signed)
Addended by: Rockwell Germany on: 03/24/2015 11:27 AM   Modules accepted: Orders

## 2015-03-24 NOTE — Telephone Encounter (Signed)
Rx request to pharmacy/SLS Requested drug refills are authorized, however, the patient needs further evaluation and/or laboratory testing before further refills are given. Ask her to make an appointment for this.  

## 2015-06-13 ENCOUNTER — Telehealth: Payer: Self-pay

## 2015-06-13 NOTE — Telephone Encounter (Signed)
Called to schedule Medicare Wellness Visit with Health Coach.  Left a message for call back.  

## 2015-06-13 NOTE — Telephone Encounter (Signed)
Patient states that she will call back to schedule.

## 2015-06-15 ENCOUNTER — Other Ambulatory Visit: Payer: Self-pay | Admitting: Physician Assistant

## 2015-06-20 ENCOUNTER — Other Ambulatory Visit: Payer: Self-pay | Admitting: Physician Assistant

## 2015-06-20 ENCOUNTER — Telehealth: Payer: Self-pay | Admitting: Physician Assistant

## 2015-06-20 NOTE — Telephone Encounter (Signed)
.  Caller name:Shahira Relationship to patient:SELF \ Can be [phone] Pharmacy:  Reason for call:PHARMACY SAID YOU REFUSED TO FILL HER TRIAMTERENE

## 2015-06-23 NOTE — Telephone Encounter (Signed)
Rx was approved and sent to the pharmacy.  Pt needs an office visit./AB/CMA

## 2015-10-03 ENCOUNTER — Other Ambulatory Visit: Payer: Self-pay | Admitting: Physician Assistant

## 2015-10-10 ENCOUNTER — Telehealth: Payer: Self-pay | Admitting: Physician Assistant

## 2015-10-10 MED ORDER — TRIAMTERENE-HCTZ 75-50 MG PO TABS: 1.0000 | ORAL_TABLET | Freq: Every day | ORAL | Status: DC

## 2015-10-10 NOTE — Telephone Encounter (Signed)
Spoke with Jeannine w/[facility] RE: patient's health, as to the reason why she is unable to come in to office for F/U visit. Caller states that patient is still going to Chemo QO week and has lots of other appointments and recently had to have [2] units of blood, which has made her feel a lot better and more mobile. Explained that patient has not been seen in >one year and has been told prior that she needs a f/u appointment for further refills; this is mandatory to make sure that health care needs are addressed and any changes are reported to her PCP, so that he can treat her accordingly. Caller understood and agreed to speak with patient about this matter and let patient's son aware as well/SLS

## 2015-10-10 NOTE — Telephone Encounter (Signed)
Will grant 30 days of medication and then she needs to be seen. She was told several months ago that she would need an appointment.  Please let me know what the barrier is to her coming in.

## 2015-10-10 NOTE — Telephone Encounter (Signed)
Caller name: Nani Skillern ([place])  Can be reached: [phone]   Reason for call: [facility] called stating that she is with patient now and patient has BP of 160/78 in r-leg. Patient is out of BP meds and too sick to come into office. [facility] requesting that BP med be refilled.

## 2015-10-13 ENCOUNTER — Other Ambulatory Visit: Payer: Self-pay | Admitting: Physician Assistant

## 2015-10-13 NOTE — Telephone Encounter (Signed)
Rockwell Germany, CMA at 10/10/2015 1:50 PM     Status: Signed       Expand All Collapse All   Spoke with Jeannine w/[facility] RE: patient's health, as to the reason why she is unable to come in to office for F/U visit. Caller states that patient is still going to Chemo QO week and has lots of other appointments and recently had to have [2] units of blood, which has made her feel a lot better and more mobile. Explained that patient has not been seen in >one year and has been told prior that she needs a f/u appointment for further refills; this is mandatory to make sure that health care needs are addressed and any changes are reported to her PCP, so that he can treat her accordingly. Caller understood and agreed to speak with patient about this matter and let patient's son aware as well/SLS

## 2015-11-12 ENCOUNTER — Other Ambulatory Visit: Payer: Self-pay | Admitting: Physician Assistant

## 2016-03-30 ENCOUNTER — Telehealth: Payer: Self-pay | Admitting: Physician Assistant

## 2016-03-30 NOTE — Telephone Encounter (Signed)
Patient has not been seen in over a year. I am happy to fill out form but Medicare requires an examination documenting need for equipment within a certain time frame (Typically 30-60 days I believe) before they will pay for the requested item. She needs to schedule OV.

## 2016-03-30 NOTE — Telephone Encounter (Signed)
Please fax form to  ATTN: Bobby @ [phone]

## 2016-03-30 NOTE — Telephone Encounter (Signed)
Caller name: Corey Skains from [employer]  Relation to pt: Call back number: [phone]  Pharmacy:  Reason for call:  Faxing over a "compression device lynthudena" and certificate of medical necessity form requesting PCP to fill out and fax back  Form faxed to [phone]

## 2016-03-30 NOTE — Telephone Encounter (Signed)
Received, forwarded to Dr. Charlett Blake. JG//CMA

## 2016-03-31 NOTE — Telephone Encounter (Signed)
Left message on voicemail for patient informing her of below and asking her to call the office to schedule an OV with her provider

## 2016-04-30 NOTE — Telephone Encounter (Signed)
LMOM with contact name and number [for return call, if needed] RE: paperwork requested to be completed for Pneumatic Compression Device and CMN. Informed requestor that patient has not been seen in >1 yr and numerous attempts have been made requesting patient callback to schedule appointment for OV without return call. Informed requestor per provider instructions, pt must have face-to-face OV for this paperwork to be completed and compliance for Insurance coverage/SLS 06/02

## 2016-07-21 IMAGING — CR DG CHEST 2V
2 series · 2 of 2 positions shown · non-contrast
Comparison: 07/25/2014

CLINICAL DATA: Productive cough for 1 week, history of carcinoma
the breast

EXAM:
CHEST  2 VIEW

[w chest pa]
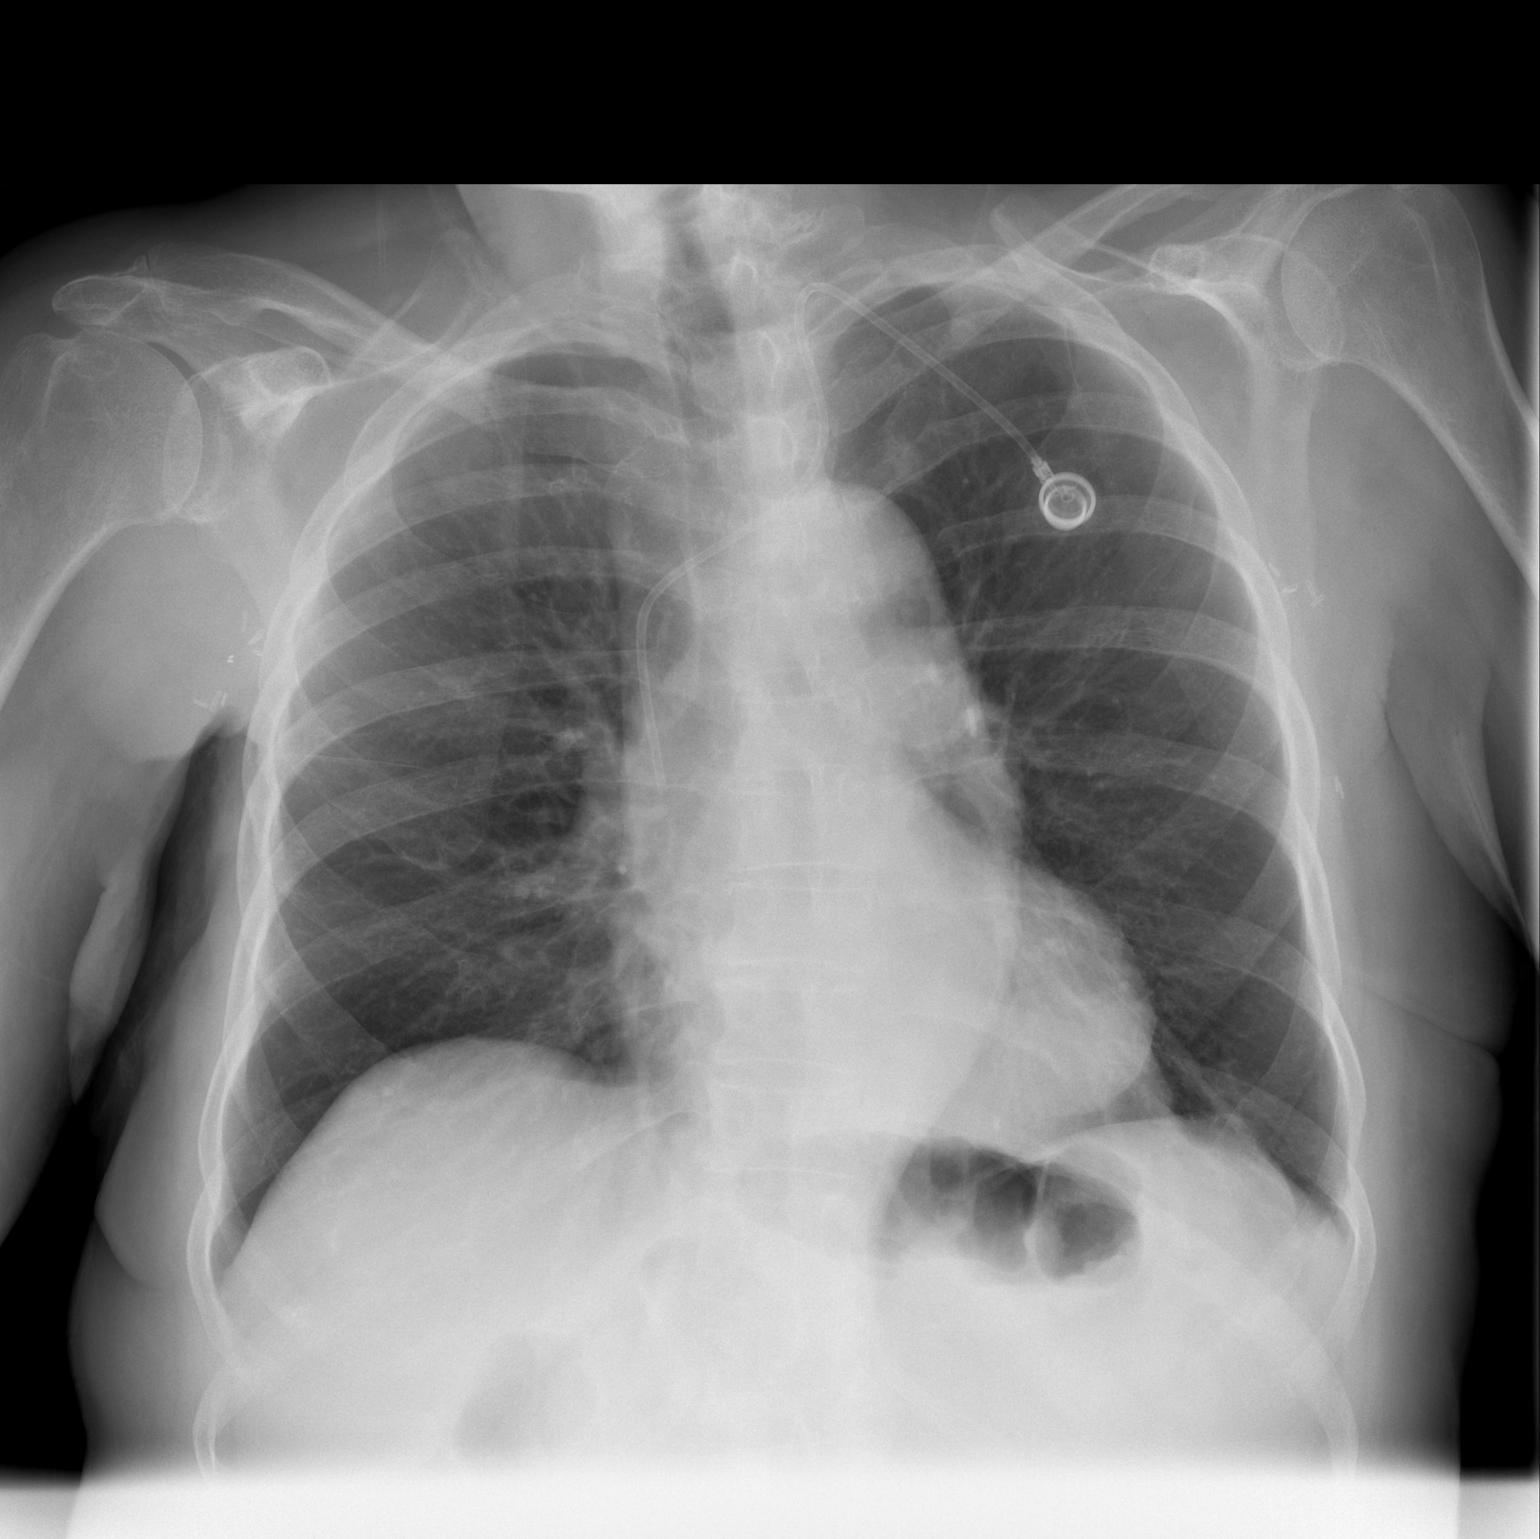

[w chest lat]
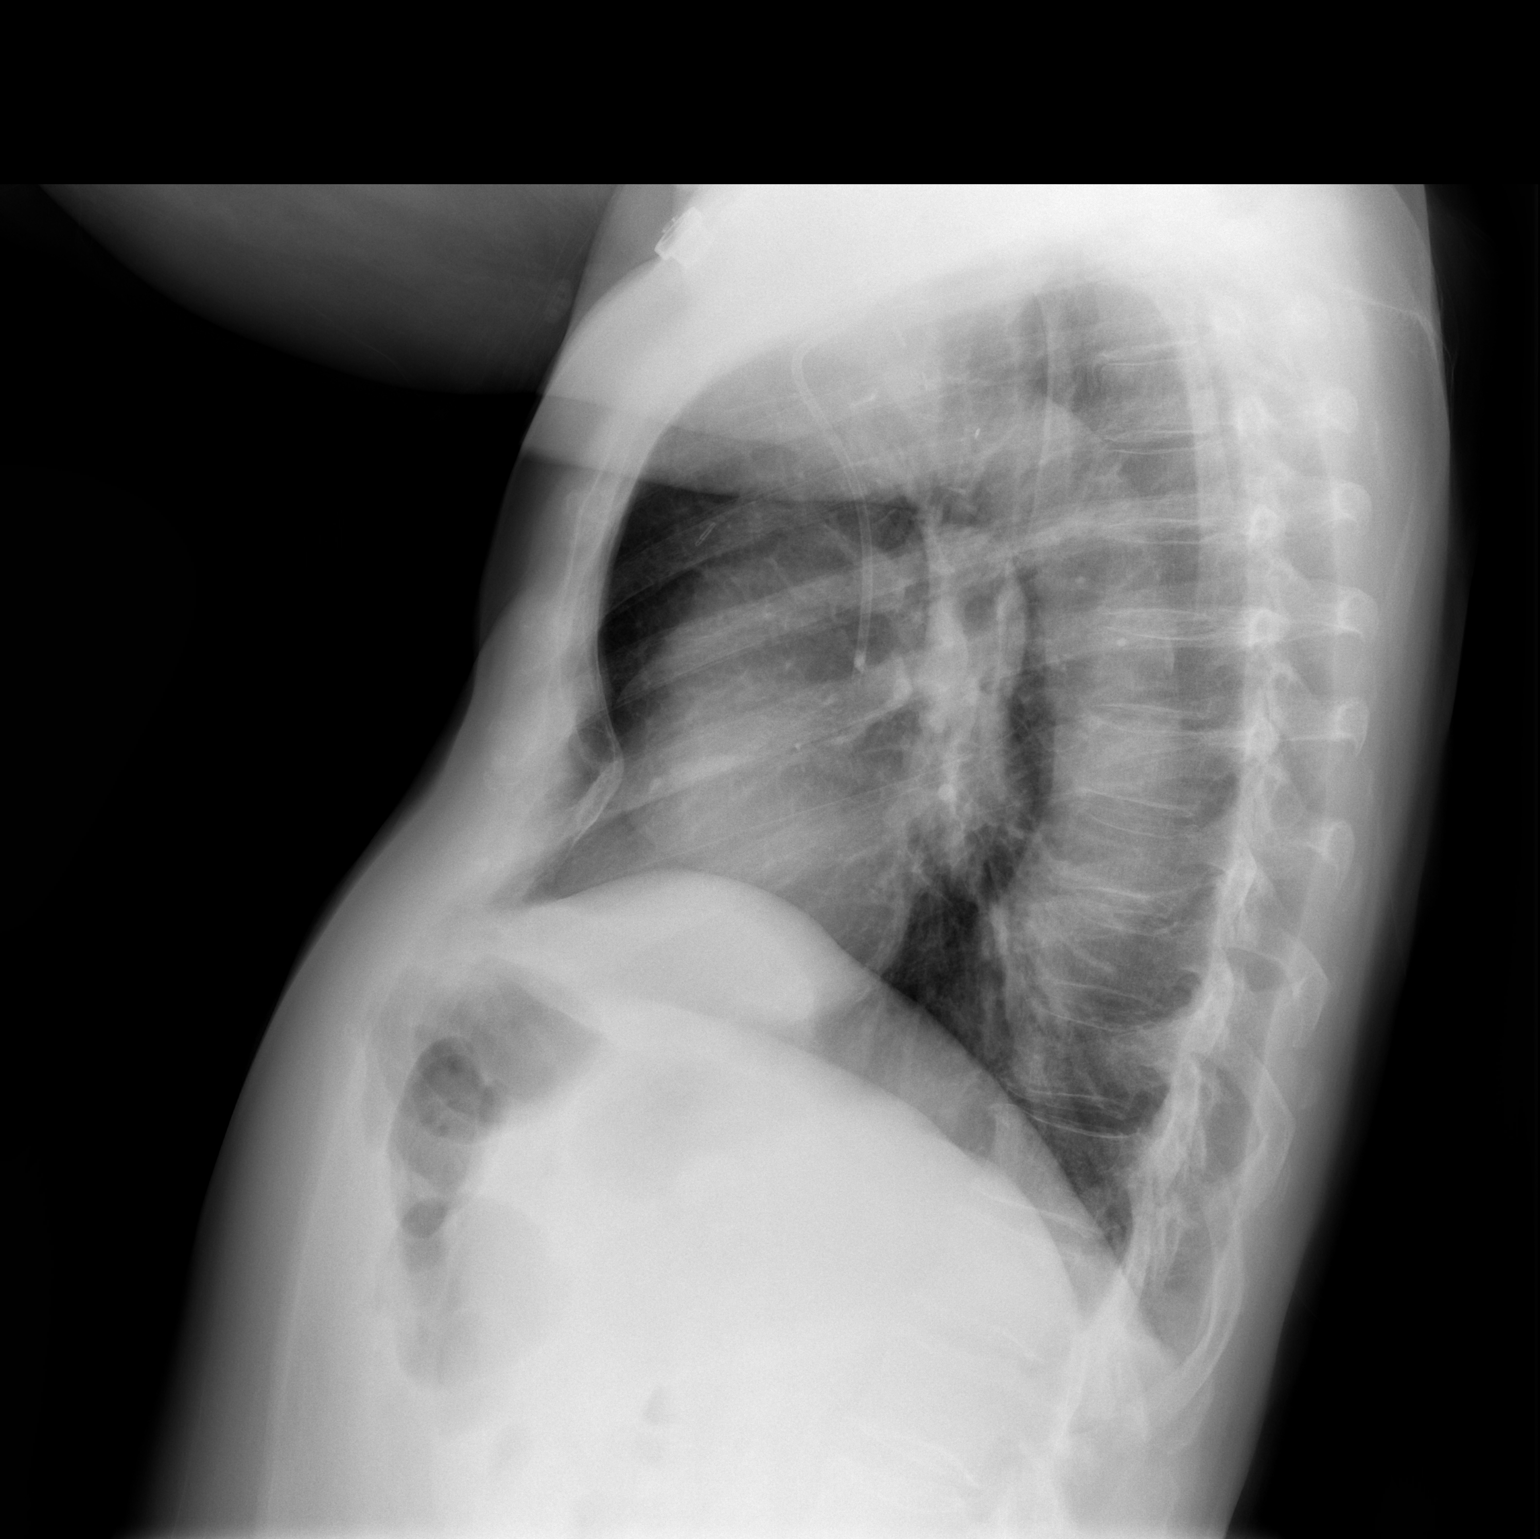

[2 of 2 positions shown; findings below may reference images not displayed]

FINDINGS: Left chest wall port is again identified and stable. The cardiac
shadow is within normal limits. Postsurgical changes are again seen
consistent with the given clinical history. No focal infiltrate or
sizable effusion is seen. No nodular changes are noted.
IMPRESSION: No acute abnormality seen.

## 2016-07-30 DEATH — deceased
